# Patient Record
Sex: Female | Born: 1935
Health system: Southern US, Community
[De-identification: ages and names within clinical notes are randomized; demographics above are authoritative.]

## PROBLEM LIST (undated history)

## (undated) DIAGNOSIS — I10 Essential (primary) hypertension: Secondary | ICD-10-CM

## (undated) DIAGNOSIS — N185 Chronic kidney disease, stage 5: Secondary | ICD-10-CM

## (undated) DIAGNOSIS — I639 Cerebral infarction, unspecified: Secondary | ICD-10-CM

## (undated) DIAGNOSIS — E119 Type 2 diabetes mellitus without complications: Secondary | ICD-10-CM

## (undated) HISTORY — DX: Essential (primary) hypertension: I10

## (undated) HISTORY — PX: FRACTURE SURGERY: SHX138

## (undated) HISTORY — DX: Type 2 diabetes mellitus without complications: E11.9

## (undated) HISTORY — PX: TUBAL LIGATION: SHX77

## (undated) HISTORY — DX: Cerebral infarction, unspecified: I63.9

## (undated) HISTORY — DX: Chronic kidney disease, stage 5: N18.5

## (undated) HISTORY — PX: COLON SURGERY: SHX602

## (undated) HISTORY — PX: EYE SURGERY: SHX253

## (undated) HISTORY — PX: SPINE SURGERY: SHX786

---

## 1997-11-25 ENCOUNTER — Other Ambulatory Visit: Admission: RE | Admit: 1997-11-25 | Discharge: 1997-11-25 | Payer: Self-pay | Admitting: Endocrinology

## 1999-05-29 ENCOUNTER — Ambulatory Visit (HOSPITAL_COMMUNITY): Admission: RE | Admit: 1999-05-29 | Discharge: 1999-05-29 | Payer: Self-pay | Admitting: Ophthalmology

## 1999-11-02 ENCOUNTER — Encounter: Admission: RE | Admit: 1999-11-02 | Discharge: 1999-11-02 | Payer: Self-pay | Admitting: Family Medicine

## 1999-11-02 ENCOUNTER — Encounter: Payer: Self-pay | Admitting: Family Medicine

## 2001-10-20 ENCOUNTER — Encounter: Payer: Self-pay | Admitting: Neurosurgery

## 2001-10-21 ENCOUNTER — Encounter: Payer: Self-pay | Admitting: Neurosurgery

## 2001-10-21 ENCOUNTER — Ambulatory Visit (HOSPITAL_COMMUNITY): Admission: RE | Admit: 2001-10-21 | Discharge: 2001-10-22 | Payer: Self-pay | Admitting: Neurosurgery

## 2002-08-31 ENCOUNTER — Encounter: Payer: Self-pay | Admitting: Family Medicine

## 2002-08-31 ENCOUNTER — Encounter: Admission: RE | Admit: 2002-08-31 | Discharge: 2002-08-31 | Payer: Self-pay | Admitting: Family Medicine

## 2003-01-14 ENCOUNTER — Ambulatory Visit (HOSPITAL_COMMUNITY): Admission: RE | Admit: 2003-01-14 | Discharge: 2003-01-14 | Payer: Self-pay | Admitting: Gastroenterology

## 2003-01-14 ENCOUNTER — Encounter (INDEPENDENT_AMBULATORY_CARE_PROVIDER_SITE_OTHER): Payer: Self-pay | Admitting: Specialist

## 2003-08-16 ENCOUNTER — Ambulatory Visit (HOSPITAL_COMMUNITY): Admission: RE | Admit: 2003-08-16 | Discharge: 2003-08-16 | Payer: Self-pay | Admitting: Family Medicine

## 2003-11-11 ENCOUNTER — Ambulatory Visit (HOSPITAL_COMMUNITY): Admission: RE | Admit: 2003-11-11 | Discharge: 2003-11-11 | Payer: Self-pay | Admitting: Family Medicine

## 2007-07-16 ENCOUNTER — Encounter: Admission: RE | Admit: 2007-07-16 | Discharge: 2007-07-16 | Payer: Self-pay | Admitting: Orthopedic Surgery

## 2007-07-24 ENCOUNTER — Encounter: Admission: RE | Admit: 2007-07-24 | Discharge: 2007-07-24 | Payer: Self-pay | Admitting: *Deleted

## 2008-07-23 ENCOUNTER — Emergency Department (HOSPITAL_COMMUNITY): Admission: EM | Admit: 2008-07-23 | Discharge: 2008-07-23 | Payer: Self-pay | Admitting: Emergency Medicine

## 2009-02-07 ENCOUNTER — Encounter: Admission: RE | Admit: 2009-02-07 | Discharge: 2009-02-07 | Payer: Self-pay | Admitting: Internal Medicine

## 2010-01-13 ENCOUNTER — Inpatient Hospital Stay (HOSPITAL_COMMUNITY)
Admission: EM | Admit: 2010-01-13 | Discharge: 2010-01-16 | Payer: Self-pay | Source: Home / Self Care | Admitting: Emergency Medicine

## 2010-01-13 ENCOUNTER — Ambulatory Visit: Payer: Self-pay | Admitting: Family Medicine

## 2010-01-15 ENCOUNTER — Encounter: Payer: Self-pay | Admitting: Family Medicine

## 2010-01-15 ENCOUNTER — Ambulatory Visit: Payer: Self-pay | Admitting: Vascular Surgery

## 2010-01-17 ENCOUNTER — Emergency Department (HOSPITAL_COMMUNITY): Admission: EM | Admit: 2010-01-17 | Discharge: 2010-01-17 | Payer: Self-pay | Admitting: Emergency Medicine

## 2010-03-15 ENCOUNTER — Encounter: Admission: RE | Admit: 2010-03-15 | Discharge: 2010-03-15 | Payer: Self-pay | Admitting: Internal Medicine

## 2010-06-28 LAB — COMPREHENSIVE METABOLIC PANEL
ALT: 23 U/L (ref 0–35)
ALT: 37 U/L — ABNORMAL HIGH (ref 0–35)
AST: 28 U/L (ref 0–37)
AST: 49 U/L — ABNORMAL HIGH (ref 0–37)
Albumin: 3.6 g/dL (ref 3.5–5.2)
Albumin: 3.9 g/dL (ref 3.5–5.2)
Alkaline Phosphatase: 61 U/L (ref 39–117)
Alkaline Phosphatase: 61 U/L (ref 39–117)
BUN: 16 mg/dL (ref 6–23)
BUN: 21 mg/dL (ref 6–23)
CO2: 27 mEq/L (ref 19–32)
CO2: 27 mEq/L (ref 19–32)
Calcium: 9.5 mg/dL (ref 8.4–10.5)
Calcium: 9.7 mg/dL (ref 8.4–10.5)
Chloride: 103 mEq/L (ref 96–112)
Chloride: 99 mEq/L (ref 96–112)
Creatinine, Ser: 1.19 mg/dL (ref 0.4–1.2)
Creatinine, Ser: 1.2 mg/dL (ref 0.4–1.2)
GFR calc Af Amer: 53 mL/min — ABNORMAL LOW (ref >60)
GFR calc Af Amer: 54 mL/min — ABNORMAL LOW (ref >60)
GFR calc non Af Amer: 44 mL/min — ABNORMAL LOW (ref >60)
GFR calc non Af Amer: 44 mL/min — ABNORMAL LOW (ref >60)
Glucose, Bld: 108 mg/dL — ABNORMAL HIGH (ref 70–99)
Glucose, Bld: 145 mg/dL — ABNORMAL HIGH (ref 70–99)
Potassium: 4 mEq/L (ref 3.5–5.1)
Potassium: 4.1 mEq/L (ref 3.5–5.1)
Sodium: 134 mEq/L — ABNORMAL LOW (ref 135–145)
Sodium: 137 mEq/L (ref 135–145)
Total Bilirubin: 0.3 mg/dL (ref 0.3–1.2)
Total Bilirubin: 0.5 mg/dL (ref 0.3–1.2)
Total Protein: 6.2 g/dL (ref 6.0–8.3)
Total Protein: 6.9 g/dL (ref 6.0–8.3)

## 2010-06-28 LAB — DIFFERENTIAL
Basophils Absolute: 0.1 10*3/uL (ref 0.0–0.1)
Basophils Absolute: 0.1 10*3/uL (ref 0.0–0.1)
Basophils Relative: 1 % (ref 0–1)
Basophils Relative: 1 % (ref 0–1)
Eosinophils Absolute: 0.3 10*3/uL (ref 0.0–0.7)
Eosinophils Absolute: 0.4 10*3/uL (ref 0.0–0.7)
Eosinophils Relative: 5 % (ref 0–5)
Eosinophils Relative: 7 % — ABNORMAL HIGH (ref 0–5)
Lymphocytes Relative: 27 % (ref 12–46)
Lymphocytes Relative: 40 % (ref 12–46)
Lymphs Abs: 1.5 10*3/uL (ref 0.7–4.0)
Lymphs Abs: 2.5 10*3/uL (ref 0.7–4.0)
Monocytes Absolute: 0.3 10*3/uL (ref 0.1–1.0)
Monocytes Absolute: 0.4 10*3/uL (ref 0.1–1.0)
Monocytes Relative: 6 % (ref 3–12)
Monocytes Relative: 7 % (ref 3–12)
Neutro Abs: 2.8 10*3/uL (ref 1.7–7.7)
Neutro Abs: 3.2 10*3/uL (ref 1.7–7.7)
Neutrophils Relative %: 45 % (ref 43–77)
Neutrophils Relative %: 61 % (ref 43–77)

## 2010-06-28 LAB — BASIC METABOLIC PANEL
BUN: 15 mg/dL (ref 6–23)
BUN: 20 mg/dL (ref 6–23)
CO2: 24 mEq/L (ref 19–32)
CO2: 26 mEq/L (ref 19–32)
Calcium: 9.5 mg/dL (ref 8.4–10.5)
Calcium: 9.7 mg/dL (ref 8.4–10.5)
Chloride: 103 mEq/L (ref 96–112)
Chloride: 104 mEq/L (ref 96–112)
Creatinine, Ser: 1.18 mg/dL (ref 0.4–1.2)
Creatinine, Ser: 1.21 mg/dL — ABNORMAL HIGH (ref 0.4–1.2)
GFR calc Af Amer: 53 mL/min — ABNORMAL LOW (ref >60)
GFR calc Af Amer: 54 mL/min — ABNORMAL LOW (ref >60)
GFR calc non Af Amer: 44 mL/min — ABNORMAL LOW (ref >60)
GFR calc non Af Amer: 45 mL/min — ABNORMAL LOW (ref >60)
Glucose, Bld: 143 mg/dL — ABNORMAL HIGH (ref 70–99)
Glucose, Bld: 223 mg/dL — ABNORMAL HIGH (ref 70–99)
Potassium: 4.3 mEq/L (ref 3.5–5.1)
Potassium: 4.4 mEq/L (ref 3.5–5.1)
Sodium: 136 mEq/L (ref 135–145)
Sodium: 138 mEq/L (ref 135–145)

## 2010-06-28 LAB — CBC
HCT: 33.8 % — ABNORMAL LOW (ref 36.0–46.0)
HCT: 34.2 % — ABNORMAL LOW (ref 36.0–46.0)
HCT: 34.8 % — ABNORMAL LOW (ref 36.0–46.0)
Hemoglobin: 11.4 g/dL — ABNORMAL LOW (ref 12.0–15.0)
Hemoglobin: 11.9 g/dL — ABNORMAL LOW (ref 12.0–15.0)
Hemoglobin: 11.9 g/dL — ABNORMAL LOW (ref 12.0–15.0)
MCH: 28.8 pg (ref 26.0–34.0)
MCH: 29.2 pg (ref 26.0–34.0)
MCH: 29.4 pg (ref 26.0–34.0)
MCHC: 33.7 g/dL (ref 30.0–36.0)
MCHC: 34.2 g/dL (ref 30.0–36.0)
MCHC: 34.8 g/dL (ref 30.0–36.0)
MCV: 84.4 fL (ref 78.0–100.0)
MCV: 85.4 fL (ref 78.0–100.0)
MCV: 85.5 fL (ref 78.0–100.0)
Platelets: 219 10*3/uL (ref 150–400)
Platelets: 265 10*3/uL (ref 150–400)
Platelets: 266 10*3/uL (ref 150–400)
RBC: 3.96 MIL/uL (ref 3.87–5.11)
RBC: 4.05 MIL/uL (ref 3.87–5.11)
RBC: 4.07 MIL/uL (ref 3.87–5.11)
RDW: 12.6 % (ref 11.5–15.5)
RDW: 12.9 % (ref 11.5–15.5)
RDW: 12.9 % (ref 11.5–15.5)
WBC: 5.4 10*3/uL (ref 4.0–10.5)
WBC: 6.1 10*3/uL (ref 4.0–10.5)
WBC: 7.1 10*3/uL (ref 4.0–10.5)

## 2010-06-28 LAB — HEMOGLOBIN A1C
Hgb A1c MFr Bld: 6.8 % — ABNORMAL HIGH (ref <5.7)
Mean Plasma Glucose: 148 mg/dL — ABNORMAL HIGH (ref <117)

## 2010-06-28 LAB — GLUCOSE, CAPILLARY
Glucose-Capillary: 106 mg/dL — ABNORMAL HIGH (ref 70–99)
Glucose-Capillary: 109 mg/dL — ABNORMAL HIGH (ref 70–99)
Glucose-Capillary: 121 mg/dL — ABNORMAL HIGH (ref 70–99)
Glucose-Capillary: 122 mg/dL — ABNORMAL HIGH (ref 70–99)
Glucose-Capillary: 125 mg/dL — ABNORMAL HIGH (ref 70–99)
Glucose-Capillary: 133 mg/dL — ABNORMAL HIGH (ref 70–99)
Glucose-Capillary: 140 mg/dL — ABNORMAL HIGH (ref 70–99)
Glucose-Capillary: 190 mg/dL — ABNORMAL HIGH (ref 70–99)
Glucose-Capillary: 83 mg/dL (ref 70–99)
Glucose-Capillary: 95 mg/dL (ref 70–99)

## 2010-06-28 LAB — URINALYSIS, ROUTINE W REFLEX MICROSCOPIC
Bilirubin Urine: NEGATIVE
Glucose, UA: NEGATIVE mg/dL
Ketones, ur: NEGATIVE mg/dL
Nitrite: NEGATIVE
Protein, ur: 100 mg/dL — AB
Specific Gravity, Urine: 1.01 (ref 1.005–1.030)
Urobilinogen, UA: 0.2 mg/dL (ref 0.0–1.0)
pH: 5.5 (ref 5.0–8.0)

## 2010-06-28 LAB — LIPID PANEL
Cholesterol: 213 mg/dL — ABNORMAL HIGH (ref 0–200)
HDL: 48 mg/dL (ref >39)
LDL Cholesterol: 133 mg/dL — ABNORMAL HIGH (ref 0–99)
Total CHOL/HDL Ratio: 4.4 RATIO
Triglycerides: 161 mg/dL — ABNORMAL HIGH (ref <150)
VLDL: 32 mg/dL (ref 0–40)

## 2010-06-28 LAB — APTT: aPTT: 23 seconds — ABNORMAL LOW (ref 24–37)

## 2010-06-28 LAB — URINE MICROSCOPIC-ADD ON

## 2010-06-28 LAB — CK TOTAL AND CKMB (NOT AT ARMC)
CK, MB: 3.3 ng/mL (ref 0.3–4.0)
Relative Index: 2.4 (ref 0.0–2.5)
Total CK: 136 U/L (ref 7–177)

## 2010-06-28 LAB — TROPONIN I: Troponin I: 0.02 ng/mL (ref 0.00–0.06)

## 2010-06-28 LAB — PROTIME-INR
INR: 0.93 (ref 0.00–1.49)
Prothrombin Time: 12.7 seconds (ref 11.6–15.2)

## 2010-07-25 LAB — BASIC METABOLIC PANEL
BUN: 12 mg/dL (ref 6–23)
CO2: 25 mEq/L (ref 19–32)
Calcium: 9.3 mg/dL (ref 8.4–10.5)
Chloride: 96 mEq/L (ref 96–112)
Creatinine, Ser: 1.01 mg/dL (ref 0.4–1.2)
GFR calc Af Amer: >60 mL/min (ref >60)
GFR calc non Af Amer: 54 mL/min — ABNORMAL LOW (ref >60)
Glucose, Bld: 173 mg/dL — ABNORMAL HIGH (ref 70–99)
Potassium: 4.3 mEq/L (ref 3.5–5.1)
Sodium: 135 mEq/L (ref 135–145)

## 2010-07-25 LAB — DIFFERENTIAL
Basophils Absolute: 0 10*3/uL (ref 0.0–0.1)
Basophils Relative: 1 % (ref 0–1)
Eosinophils Absolute: 0.1 10*3/uL (ref 0.0–0.7)
Eosinophils Relative: 1 % (ref 0–5)
Lymphocytes Relative: 19 % (ref 12–46)
Lymphs Abs: 1.6 10*3/uL (ref 0.7–4.0)
Monocytes Absolute: 0.3 10*3/uL (ref 0.1–1.0)
Monocytes Relative: 4 % (ref 3–12)
Neutro Abs: 6.6 10*3/uL (ref 1.7–7.7)
Neutrophils Relative %: 76 % (ref 43–77)

## 2010-07-25 LAB — CBC
HCT: 34.4 % — ABNORMAL LOW (ref 36.0–46.0)
Hemoglobin: 11.7 g/dL — ABNORMAL LOW (ref 12.0–15.0)
MCHC: 34 g/dL (ref 30.0–36.0)
MCV: 86.5 fL (ref 78.0–100.0)
Platelets: 280 10*3/uL (ref 150–400)
RBC: 3.98 MIL/uL (ref 3.87–5.11)
RDW: 14.2 % (ref 11.5–15.5)
WBC: 8.7 10*3/uL (ref 4.0–10.5)

## 2010-08-31 NOTE — H&P (Signed)
[place]. [facility]  Patient:    Rose Cruz, Rose Cruz Visit Number: [id number] MRN: [id number]          Service Type: SUR Location: [id number] Attending Physician:  Melton Krebs Dictated by:   Elizabeth Sauer, M.D. Admit Date:  10/21/2001 Discharge Date: 10/22/2001                           History and Physical  ADMITTING DIAGNOSIS: Herniated disk, L4-5 on the right.  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  MRI has shown a herniated disk at L4-5 on the right and she is admitted for lumbar diskectomy.  PAST MEDICAL HISTORY:  1. Adult onset diabetes.  2. Hypertension.  MEDICATIONS:  1. ______ 500 mg two tablets b.i.d.  2. Atacand q.d.  3. Bayer aspirin one q.d.  4. Centrum Silver.  5. Vicodin.  6. Bextra.  ALLERGIES: PENICILLIN.  PAST SURGICAL HISTORY: Tubal ligation.  She is not on any hormone replacement.  SOCIAL HISTORY: She does not smoke or drink.  Works at the [employer].  FAMILY HISTORY: Not given.  REVIEW OF SYSTEMS: Remarkable for glasses, hypertension, leg pain, back pain, and vomiting.  PHYSICAL EXAMINATION:  HEENT: Within normal limits.  NECK: Reasonable range of motion.  CHEST: Clear.  CARDIAC: Regular rate and rhythm.  ABDOMEN: Nontender.  No hepatosplenomegaly.  EXTREMITIES: Without clubbing or cyanosis.  GU: Examination deferred.  EXTREMITIES: Peripheral pulses good.  NEUROLOGIC: She is awake and alert and oriented.  Cranial nerves intact. Motor examination shows 5/5 strength throughout the upper and lower extremities save for hesitancy of knee extensors on the right side.  Right knee jerk is absent, left knee jerk is 2.  Ankle jerks absent. Straight-leg-raise positive on the right.  LABORATORY DATA: Films have been reviewed above.  CLINICAL IMPRESSION: Lumbar herniated disk at  L4-5 with right L4 radiculopathy.  PLAN: The plan is for lumbar laminectomy and diskectomy.  The risks and benefits of this approach have been discussed with her and she wishes to proceed. Dictated by:   Elizabeth Sauer, M.D. Attending Physician:  Melton Krebs DD:  10/21/01 TD:  10/24/01 Job: [id number]

## 2010-08-31 NOTE — Op Note (Signed)
   NAME:  Rose Cruz, Rose Cruz                         ACCOUNT NO.:  [id number]   MEDICAL RECORD NO.:  [id number]                   PATIENT TYPE:  AMB   LOCATION:  ENDO                                 FACILITY:  [facility]   PHYSICIAN:  John C. Amedeo Plenty, M.D.                 DATE OF BIRTH:  23-May-1932   DATE OF PROCEDURE:  01/14/2003  DATE OF DISCHARGE:                                 OPERATIVE REPORT   PROCEDURE:  Colonoscopy.   INDICATIONS FOR PROCEDURE:  Anemia and family history of colon cancer in a  second degree relative.   DESCRIPTION OF PROCEDURE:  The patient was placed in the left lateral  decubitus position then placed on the pulse monitor with continuous low flow  oxygen delivered by nasal cannula. She was sedated with 50 mcg IV fentanyl  and 4 mg IV Versed. The Olympus video colonoscope was inserted into the  rectum and advanced to the cecum, confirmed by transillumination at  McBurney's point and visualization of the ileocecal valve and appendiceal  orifice. The prep was excellent. The cecum and ascending colon appeared  normal with no masses, polyps, diverticula or other mucosal abnormalities.  Within the transverse colon, there was a 6 mm polyp fulgurated by hot  biopsy. Otherwise, the remaining transverse colon appeared normal and did  the descending, sigmoid and rectum. The scope was then withdrawn and the  patient returned to the recovery room in stable condition. She tolerated the  procedure well and there were no immediate complications.   IMPRESSION:  Transverse colon polyp fulgurated by hot biopsy.   PLAN:  Await histology to determine method and interval for future colon  screening.                                               John C. Amedeo Plenty, M.D.    [facility]  D:  01/14/2003  T:  01/15/2003  Job:  [id number]   cc:   Biagio Borg, M.D.  [address] [place]  [place]  [place] [address]  Fax: [phone]

## 2010-08-31 NOTE — Op Note (Signed)
[place]. [facility]  Patient:    Rose Cruz, Rose Cruz Visit Number: [id number] MRN: [id number]          Service Type: SUR Location: [id number] Attending Physician:  Melton Krebs Dictated by:   Elizabeth Sauer, M.D. Proc. Date: 10/21/01 Admit Date:  10/21/2001 Discharge Date: 10/22/2001                             Operative Report  PREOPERATIVE DIAGNOSIS:  Herniated disk at L4-5 on the right.  POSTOPERATIVE DIAGNOSIS:  Herniated disk at L4-5 on the right.  PROCEDURE:  Right L4-5 laminectomy and diskectomy.  SURGEON:  Elizabeth Sauer, M.D.  NURSE ASSISTANT:  [facility].  DOCTOR ASSISTANT: Otilio Connors, M.D.  ANESTHESIA:  General endotracheal.  PREPARATION:  Sterile Betadine prep and scrub with alcohol wipe.  COMPLICATIONS:  None.  DESCRIPTION OF PROCEDURE:  This is a 77 year old right-handed white lady with a right L4 radiculopathy and a superiorly extruded disk at L4-5.  She was taken to the operating room and smoothly anesthetized and intubated and placed prone on the operating table.  Following shave, prep, and drape in the usual sterile fashion, the skin was infiltrated with 1% lidocaine with 1:400,000 epinephrine.  Skin was incised from the top of L4 to the top of L3, and the lamina of L4 and L3 were exposed on the right side in the subperiosteal plane. Intraoperative x-ray showed a mark under 3.  Hemisemilaminectomy was carried out at 4 to its superior end and the top of the ligamentum flavum that was removed in a retrograde fashion.  The lateral margin of the laminectomy was enlarged to visualize the lateral margin of the thecal sac.  The 5 root was retracted medially and the disk space was immediately evident.  Working out into the neural foramen, a large free fragment of disk was isolated and removed from underneath the 4 root in the foramen.  The annular fibers of the disk were then incised and the disk material removed from within it.   All graspable fragments were retrieved, and the neural foramen was also carefully explored once again to make sure that it contained no fragments.  Having completed this, the wound was irrigated and hemostasis assured.  Depo-Medrol- soaked fat was used to fill the laminectomy defect.  The fascia was reapproximated with 0 Vicryl in interrupted fashion, the subcutaneous tissue was reapproximated with 0 Vicryl in interrupted fashion.  The subcuticular tissue was reapproximated with 3-0 Vicryl in interrupted fashion.  The skin was closed with 3-0 nylon in a running locked fashion.  Bacitracin and Telfa dressing was applied and made occlusive with OpSite.  The patient returned to the recovery room in good condition. Dictated by:   Elizabeth Sauer, M.D. Attending Physician:  Melton Krebs DD:  10/21/01 TD:  10/24/01 Job: [id number]

## 2010-08-31 NOTE — H&P (Signed)
[place]. [facility]  Patient:    Rose Cruz, Rose Cruz                      MRN: [id number] Adm. Date:  [id number] Disc. Date: [id number] Attending:  Charlette Caffey                         History and Physical  CHIEF COMPLAINT:  This was a planned outpatient surgical admission of this 77 year old diabetic admitted for cataract implant surgery of the right eye.  HISTORY OF PRESENT ILLNESS:  This patient has been followed in my office since referral by Dr. Fabio Pierce in November 2000.  At that time, the patient was complaining of blurred vision in both eyes of at least one months duration. Examination revealed a visual acuity of 20/50 right eye, 20/50 left eye without  correction and 20/30 in each eye with correction.  Slit lamp examination revealed nuclear lens sclerosis.  Fundus examination revealed background diabetic retinopathy in both eyes with scattered hemorrhages, microaneurysms and hard exudates.  The patient continued to be followed and was noted to have progressive decrease in vision associated.  The patient had previous laser photocoagulation to the left eye on April 26, 1999.  Visual acuity continued to deteriorate and it was felt that the cataracts were becoming progressively worse.  When her vision had deteriorated to 20/200 right eye, 20/50 left eye, it was felt that further laser photocoagulation could not be carried out due to the increased cataract change. It was therefore elected to proceed with cataract implant surgery of the right eye at this time, left eye later.  The patient signed an informed consent and arrangements were made for outpatient admission at this time.  PAST MEDICAL HISTORY: The patient continues under the care of Dr. Forde Dandy and it elt to be in satisfactory condition for the proposed surgery.  The patient takes multiple medications under his direction including Glucophage, Tenoretic and Altace.  She is  said to be allergic to penicillin and is felt to be an average isk for the proposed surgery under local anesthesia.  REVIEW OF SYSTEMS:  No cardiorespiratory complaints.  PHYSICAL EXAMINATION:  GENERAL: The patient is a pleasant, well-nourished, well-developed white female in no acute distress.  HEENT:  Eyes: Ocular exam as noted above.  CHEST:  Lungs clear to percussion and auscultation.  HEART:  Normal sinus rhythm.  No cardiomegaly.  No murmurs.  ABDOMEN:  Negative.  EXTREMITIES:  Negative.  ADMISSION DIAGNOSES: 1. Posterior subcapsular cataract and nuclear cataract formation, right eye. 2. Diabetic retinopathy, both eyes.  SURGICAL PLAN: Planned extracapsular cataract extraction with primary insertion of posterior chamber intraocular lens implant, right eye.DD:  06/27/99 TD:  06/27/99 Job: [id number]

## 2010-08-31 NOTE — Op Note (Signed)
. [facility]  Patient:    Rose Cruz, Rose Cruz                      MRN: [id number] Proc. Date: 05/29/99 Adm. Date:  [id number] Disc. Date: [id number] Attending:  Charlette Caffey                           Operative Report  PREOPERATIVE DIAGNOSIS:  Cortical nuclear and posterior subcapsular cataract, right eye.  POSTOPERATIVE DIAGNOSIS:  Cortical nuclear and posterior subcapsular cataract, right eye.  OPERATION:  Planned extracapsular cataract extraction - phacoemulsification, primary insertion of posterior chamber intraocular lens implant.  SURGEON:  Garey Ham, M.D.  ASSISTANT:  Nurse  ANESTHESIA:  Local 4% xylocaine, 0.75 Marcaine retrobulbar block.  Topical tetracaine intraocular xylocaine.  Anesthesia standby required in this diabetic  patient.  The patient was give sodium pentothal or Ditropan intravenously during the period of retrobulbar injection.  DESCRIPTION OF PROCEDURE:  After the patient was prepped and draped, a speculum was inserted in the right eye.  The eye was turned downward and a superior rectus traction suture placed. Schiotz tonometry was recorded at 5 to 7 scale units with a 5.5 gram weight.  A peritomy was performed adjacent to the limbus from the 11 to 1:00 position.  The corneoscleral junction was cleaned and a corneoscleral groove made with a 45 degree Superblade.  The anterior chamber was then entered with the 2.5 mm diamond keratome at the 12:00 position and the 15 degree blade at the 2:30 position.  Using a bent 26 gauze needle on a Healon syringe a circular capsulorrhexis was begun and then completed with the Graybo forceps. Hydrodissection and hydrodelineation were performed using 1% xylocaine.  The lens nucleus was then emulsified with the phacoemulsifier.  A total ultrasonic time f 1 minute 34 seconds, average power level 15%.  The residual cortex was aspirated ith the irrigation aspiration  tip.  The posterior capsule appeared intact with a brilliant red fundus reflex.  It was therefore elected in this diabetic patient to use a polymethyl methacrylate lens rather than a silicone lens. A Storz model EZE-60 was inserted with the Shepherd forceps.  Diopter power +19.00.  This appeared to be well centered.  The pupil was then constricted with Miochol ophthalmic solution after the Healon which had been used during the procedure was aspirated.  Two sutures were used to close the enlarged superior incision at the 12:00 site.  Eserine ophthalmic ointment was instilled in the conjunctival cul-de-sac and a light patch and protector shield applied.  Duration of procedure and anesthesia administration 45 minutes.   The patient tolerated the procedure  well in general and left the operating room for the recovery room in good condition. DD:  06/27/99 TD:  06/27/99 Job: [id number]

## 2011-07-24 ENCOUNTER — Other Ambulatory Visit: Payer: Self-pay | Admitting: Internal Medicine

## 2011-08-28 ENCOUNTER — Ambulatory Visit (INDEPENDENT_AMBULATORY_CARE_PROVIDER_SITE_OTHER): Payer: PRIVATE HEALTH INSURANCE | Admitting: Internal Medicine

## 2011-08-28 VITALS — BP 137/65 | HR 72 | Temp 98.2°F | Resp 16 | Ht 64.0 in | Wt 161.0 lb

## 2011-08-28 DIAGNOSIS — D649 Anemia, unspecified: Secondary | ICD-10-CM | POA: Diagnosis not present

## 2011-08-28 DIAGNOSIS — E119 Type 2 diabetes mellitus without complications: Secondary | ICD-10-CM

## 2011-08-28 DIAGNOSIS — R238 Other skin changes: Secondary | ICD-10-CM

## 2011-08-28 DIAGNOSIS — IMO0001 Reserved for inherently not codable concepts without codable children: Secondary | ICD-10-CM | POA: Diagnosis not present

## 2011-08-28 DIAGNOSIS — E785 Hyperlipidemia, unspecified: Secondary | ICD-10-CM | POA: Diagnosis not present

## 2011-08-28 DIAGNOSIS — I10 Essential (primary) hypertension: Secondary | ICD-10-CM

## 2011-08-28 DIAGNOSIS — M791 Myalgia, unspecified site: Secondary | ICD-10-CM

## 2011-08-28 LAB — LIPID PANEL
Cholesterol: 165 mg/dL (ref 0–200)
HDL: 57 mg/dL (ref >39)
LDL Cholesterol: 84 mg/dL (ref 0–99)
Total CHOL/HDL Ratio: 2.9 Ratio
Triglycerides: 121 mg/dL (ref <150)
VLDL: 24 mg/dL (ref 0–40)

## 2011-08-28 LAB — COMPREHENSIVE METABOLIC PANEL
ALT: 19 U/L (ref 0–35)
AST: 29 U/L (ref 0–37)
Albumin: 4.2 g/dL (ref 3.5–5.2)
Alkaline Phosphatase: 52 U/L (ref 39–117)
BUN: 25 mg/dL — ABNORMAL HIGH (ref 6–23)
CO2: 29 mEq/L (ref 19–32)
Calcium: 10.2 mg/dL (ref 8.4–10.5)
Chloride: 104 mEq/L (ref 96–112)
Creat: 1.5 mg/dL — ABNORMAL HIGH (ref 0.50–1.10)
Glucose, Bld: 99 mg/dL (ref 70–99)
Potassium: 4.6 mEq/L (ref 3.5–5.3)
Sodium: 140 mEq/L (ref 135–145)
Total Bilirubin: 0.4 mg/dL (ref 0.3–1.2)
Total Protein: 6.2 g/dL (ref 6.0–8.3)

## 2011-08-28 LAB — CBC
HCT: 32.6 % — ABNORMAL LOW (ref 36.0–46.0)
Hemoglobin: 11.2 g/dL — ABNORMAL LOW (ref 12.0–15.0)
MCH: 29.3 pg (ref 26.0–34.0)
MCHC: 34.4 g/dL (ref 30.0–36.0)
MCV: 85.3 fL (ref 78.0–100.0)
Platelets: 309 10*3/uL (ref 150–400)
RBC: 3.82 MIL/uL — ABNORMAL LOW (ref 3.87–5.11)
RDW: 13.4 % (ref 11.5–15.5)
WBC: 5.3 10*3/uL (ref 4.0–10.5)

## 2011-08-28 LAB — POCT GLYCOSYLATED HEMOGLOBIN (HGB A1C): Hemoglobin A1C: 6.3

## 2011-08-28 LAB — CK: Total CK: 256 U/L — ABNORMAL HIGH (ref 7–177)

## 2011-08-28 LAB — POCT SEDIMENTATION RATE: POCT SED RATE: 25 mm/hr — AB (ref 0–22)

## 2011-08-28 MED ORDER — METFORMIN HCL 1000 MG PO TABS: 1000.0000 mg | ORAL_TABLET | Freq: Two times a day (BID) | ORAL | Status: DC

## 2011-08-28 MED ORDER — LOVASTATIN 10 MG PO TABS: 10.0000 mg | ORAL_TABLET | Freq: Every day | ORAL | Status: DC

## 2011-08-28 MED ORDER — LOSARTAN POTASSIUM-HCTZ 100-25 MG PO TABS: 1.0000 | ORAL_TABLET | Freq: Every day | ORAL | Status: DC

## 2011-08-28 MED ORDER — AMLODIPINE BESYLATE 10 MG PO TABS: 10.0000 mg | ORAL_TABLET | Freq: Every day | ORAL | Status: DC

## 2011-08-28 NOTE — Progress Notes (Signed)
Subjective:    Patient ID: Rose Cruz, female    DOB: [birth year], 78 y.o.   MRN: [id number]  HPIHere for followup of chronic medical problems She continues to complain of not being able to do as much as she used to. She is troubled at times by pain in her posterior hips as she tries to get up from sitting. This is not a consistent problem and is rarely present on arising from bed. There seems to be a deep ache in the muscles of the buttocks without change in the range of motion of the hips. She decreased her intake of iron and calcium and felt like this made her muscles better. She continues to remain active was in zumba classes twice a week and walking on a daily basis but feels she could do more.    Review of Systems  Constitutional: Negative for fever, appetite change and unexpected weight change.  HENT: Negative for hearing loss and tinnitus.   Eyes: Negative for photophobia and visual disturbance.  Respiratory: Negative for apnea, cough, choking and shortness of breath.   Cardiovascular: Negative for chest pain, palpitations and leg swelling.  Gastrointestinal: Negative for abdominal pain, diarrhea and constipation.  Genitourinary: Negative for difficulty urinating.  Musculoskeletal: Negative for joint swelling and gait problem.  Skin: Negative.   Neurological: Negative for dizziness, tremors, speech difficulty, weakness, light-headedness and headaches.       Status post CVA  Hematological:       She notes easy bruising on the forearms  Psychiatric/Behavioral: Negative for confusion, sleep disturbance, self-injury, dysphoric mood, decreased concentration and agitation. The patient is not nervous/anxious.        Objective:   Physical Exam  Constitutional: She is oriented to person, place, and time. She appears well-developed and well-nourished.  Eyes: EOM are normal. Pupils are equal, round, and reactive to light.  Neck: Neck supple. No thyromegaly present.  Cardiovascular:  Normal rate, regular rhythm, normal heart sounds and intact distal pulses.   No murmur heard. Pulmonary/Chest: Effort normal and breath sounds normal.  Abdominal: Bowel sounds are normal.  Musculoskeletal: She exhibits no edema and no tenderness.  Neurological: She is alert and oriented to person, place, and time. No cranial nerve deficit.       No sensory losses in the extremities  Skin:       Superficial bruises on both forearms/she has a small skin tag in the popliteal fossa which i removed with forceps  Psychiatric: She has a normal mood and affect. Her behavior is normal. Judgment and thought content normal.      Results for orders placed in visit on 08/28/11  SEDIMENTATION RATE-Canceled      Component Value Range   PRODUCT CODE      POCT GLYCOSYLATED HEMOGLOBIN (HGB A1C)      Component Value Range   Hemoglobin A1C 6.3    POCT SEDIMENTATION RATE      Component Value Range   POCT SED RATE 25 (*) 0 - 22 (mm/hr)       Assessment & Plan:   1. Myalgia  CK, Sedimentation rate, POCT SEDIMENTATION RATE  2. DM (diabetes mellitus)  CBC, Comprehensive metabolic panel, POCT glycosylated hemoglobin (Hb A1C), metFORMIN (GLUCOPHAGE) 1000 MG tablet, POCT SEDIMENTATION RATE  3. Hyperlipemia  Lipid panel, lovastatin (MEVACOR) 10 MG tablet, POCT SEDIMENTATION RATE  4. Anemia -Normocytic with no clear etiology POCT SEDIMENTATION RATE  5. HTN (hypertension)  amLODipine (NORVASC) 10 MG tablet, losartan-hydrochlorothiazide (HYZAAR) 100-25 MG per  tablet, POCT SEDIMENTATION RATE  6. Easy bruising -normal secondary to thin skin POCT SEDIMENTATION RATE   Consider low-dose statin as a possible cause of problem #1 Increase activity to see if endurance increases Notify lab work Meds ordered this encounter  Medications  .       .       .       . fish oil-omega-3 fatty acids 1000 MG capsule    Sig: Take 2 g by mouth daily.  . calcium carbonate (OS-CAL) 600 MG TABS    Sig: Take 600 mg by mouth  2 (two) times daily with a meal.  . aspirin 325 MG tablet    Sig: Take 325 mg by mouth daily.  . Multiple Vitamin (MULTIVITAMIN) tablet    Sig: Take 1 tablet by mouth daily.  . Ferrous Sulfate Dried 200 (65 FE) MG TABS    Sig: Take by mouth.  Marland Kitchen OVER THE COUNTER MEDICATION    Sig: Vitamin D 1000iu taking along w/Calciumm 600  . metFORMIN (GLUCOPHAGE) 1000 MG tablet    Sig: Take 1 tablet (1,000 mg total) by mouth 2 (two) times daily with a meal.    Dispense:  180 tablet    Refill:  1  . amLODipine (NORVASC) 10 MG tablet    Sig: Take 1 tablet (10 mg total) by mouth daily.    Dispense:  90 tablet    Refill:  1  . losartan-hydrochlorothiazide (HYZAAR) 100-25 MG per tablet    Sig: Take 1 tablet by mouth daily.    Dispense:  90 tablet    Refill:  1  . lovastatin (MEVACOR) 10 MG tablet    Sig: Take 1 tablet (10 mg total) by mouth at bedtime.    Dispense:  90 tablet    Refill:  1   Recheck 6 months

## 2011-08-29 LAB — SEDIMENTATION RATE: Sed Rate: 10 mm/hr (ref 0–22)

## 2011-09-03 ENCOUNTER — Encounter: Payer: Self-pay | Admitting: Internal Medicine

## 2011-09-06 ENCOUNTER — Telehealth: Payer: Self-pay

## 2011-09-06 NOTE — Telephone Encounter (Signed)
She could take her Mevacor every other day but I don't see another change that we could make at this point

## 2011-09-06 NOTE — Telephone Encounter (Signed)
Dr. Laney Pastor was supposed a note on the abnormal labs.  Patient is willing to wait until Dr. Laney Pastor returns.  Trying to reduce the number of medications she is taking.

## 2011-09-07 NOTE — Telephone Encounter (Signed)
Spoke with pt and advised message from Dr. Laney Pastor. Pt understood

## 2011-10-25 ENCOUNTER — Telehealth: Payer: Self-pay

## 2011-10-25 NOTE — Telephone Encounter (Signed)
Pt is wanting to talk with someone about metphormin and also make sure that she does not need to  almlodipine bsylate  Any more      [phone] ok to leave a message

## 2011-10-27 NOTE — Telephone Encounter (Signed)
Spoke with pt advised her not to discontinue Amlodipine and she will come by to see Dr Laney Pastor to discuss medications.

## 2011-10-27 NOTE — Telephone Encounter (Signed)
What are her questions about metformin? I do not see anything in her chart regarding D/C the amlodipine, so she should continue it. She should return for re-evaluation in November, per Dr. Ninfa Meeker last note.

## 2011-12-31 ENCOUNTER — Telehealth: Payer: Self-pay

## 2011-12-31 NOTE — Telephone Encounter (Signed)
PT CALLED AGAIN FOR BARBARA  [phone]

## 2011-12-31 NOTE — Telephone Encounter (Signed)
Pt dropped off form to be completed for her to work for [employer]. Form and chart are at Elwyn Reach' desk.  LMOM for pt to CB. Tell pt that I was able to find date of her last Tdap in our records, but we do not have records of any other immunizations for pt or TB results to fill in on form. Does pt need these on form? If so, she can bring [place] a copy of immunizations and we can do a TB test for her if needed. Dr Laney Pastor will be in tomorrow and may be able to complete the rest then.

## 2011-12-31 NOTE — Telephone Encounter (Signed)
Called pt back and she reported that [employer] may not need all the info on all immunizations, but she definitely needs a TB test done. Pt stated that she will come in tomorrow afternoon the have a PPD placed. Pt is aware that it takes 48-72 hours to get results read after placement. Form is in pt's paper chart to be completed when pt comes for PPD placement and filed back.

## 2012-01-01 ENCOUNTER — Ambulatory Visit (INDEPENDENT_AMBULATORY_CARE_PROVIDER_SITE_OTHER): Payer: Medicare Other | Admitting: Family Medicine

## 2012-01-01 VITALS — BP 138/58 | HR 62 | Temp 97.8°F | Resp 16 | Ht 63.5 in | Wt 164.2 lb

## 2012-01-01 DIAGNOSIS — E119 Type 2 diabetes mellitus without complications: Secondary | ICD-10-CM | POA: Diagnosis not present

## 2012-01-01 DIAGNOSIS — Z111 Encounter for screening for respiratory tuberculosis: Secondary | ICD-10-CM

## 2012-01-01 DIAGNOSIS — E118 Type 2 diabetes mellitus with unspecified complications: Secondary | ICD-10-CM | POA: Insufficient documentation

## 2012-01-01 DIAGNOSIS — I10 Essential (primary) hypertension: Secondary | ICD-10-CM | POA: Diagnosis not present

## 2012-01-01 NOTE — Progress Notes (Signed)
[facility] [address] [place] [address] [phone]  Date:  01/01/2012   Name:  Rose Cruz   DOB:  [birth year]   MRN:  [id number]  PCP:  No primary provider on file.    Chief Complaint: TB skin test   History of Present Illness:  Rose Cruz is a 78 y.o. 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   She has well- controlled HTN and DM.  She has no TB risk factors such as foreign origin or TB exposure or incarceration.  No symptoms such as hemoptysis, persistent cough, night sweats or unexplained weight loss.  She has had a PPD in the past which was negative.   There is no problem list on file for this patient.   No past medical history on file.  No past surgical history on file.  History  Substance Use Topics  . Smoking status: Never Smoker   . Smokeless tobacco: Not on file  . Alcohol Use: No    No family history on file.  Allergies  Allergen Reactions  . Penicillins     Whelps, swelling  . Statins     Legs hurt    Medication list has been reviewed and updated.  Current Outpatient Prescriptions on File Prior to Visit  Medication Sig Dispense Refill  . amLODipine (NORVASC) 10 MG tablet Take 1 tablet (10 mg total) by mouth daily.  90 tablet  1  . aspirin 325 MG tablet Take 325 mg by mouth daily.      . calcium carbonate (OS-CAL) 600 MG TABS Take 600 mg by mouth 2 (two) times daily with a meal.      . Ferrous Sulfate Dried 200 (65 FE) MG TABS Take by mouth.      . fish oil-omega-3 fatty acids 1000 MG capsule Take 2 g by mouth daily.      Marland Kitchen losartan-hydrochlorothiazide (HYZAAR) 100-25 MG per tablet Take 1 tablet by mouth daily.  90 tablet  1  . lovastatin (MEVACOR) 10 MG tablet Take 1 tablet (10 mg total) by mouth at bedtime.  90 tablet  1  . metFORMIN (GLUCOPHAGE) 1000 MG tablet Take 1 tablet (1,000 mg total) by mouth 2 (two) times  daily with a meal.  180 tablet  1  . Multiple Vitamin (MULTIVITAMIN) tablet Take 1 tablet by mouth daily.      Marland Kitchen OVER THE COUNTER MEDICATION Vitamin D 1000iu taking along w/Calciumm 600        Review of Systems:  As per HPI- otherwise negative.   Physical Examination: Filed Vitals:   01/01/12 1449  BP: 138/58  Pulse: 62  Temp: 97.8 F (36.6 C)  Resp: 16   Filed Vitals:   01/01/12 1449  Height: 5' 3.5" (1.613 m)  Weight: 164 lb 3.2 oz (74.481 kg)   Body mass index is 28.63 kg/(m^2). Ideal Body Weight: Weight in (lb) to have BMI = 25: 143.1   GEN: WDWN, NAD, Non-toxic, A & O x 3 HEENT: Atraumatic, Normocephalic. Neck supple. No masses, No LAD. Bilateral TM wnl, oropharynx normal.  PEERL,EOMI.   Ears and Nose: No external deformity. CV: RRR, No M/G/R. No JVD. No thrill. No extra heart sounds. PULM: CTA B, no wheezes, crackles, rhonchi. No retractions. No resp. distress. No accessory muscle use. ABD: S, NT, ND, +BS. No rebound. No HSM. EXTR:  No c/c/e NEURO Normal gait.  PSYCH: Normally interactive. Conversant. Not depressed or anxious appearing.  Calm demeanor.    Assessment and Plan: 1. HTN (hypertension)   2. Diabetes mellitus   3. Screening-pulmonary TB    Rose Cruz is here today for a PE for employment.  She is cleared for employment, but did not do a PPD for her today as she is low risk and there is a Producer, television/film/video.    Lamar Blinks, MD

## 2012-01-22 ENCOUNTER — Other Ambulatory Visit: Payer: Self-pay | Admitting: Internal Medicine

## 2012-01-22 ENCOUNTER — Encounter: Payer: Self-pay | Admitting: Internal Medicine

## 2012-01-22 ENCOUNTER — Ambulatory Visit (INDEPENDENT_AMBULATORY_CARE_PROVIDER_SITE_OTHER): Payer: Medicare Other | Admitting: Internal Medicine

## 2012-01-22 VITALS — BP 106/58 | HR 66 | Temp 98.9°F | Resp 16 | Ht 63.75 in | Wt 162.4 lb

## 2012-01-22 DIAGNOSIS — K635 Polyp of colon: Secondary | ICD-10-CM

## 2012-01-22 DIAGNOSIS — G459 Transient cerebral ischemic attack, unspecified: Secondary | ICD-10-CM

## 2012-01-22 DIAGNOSIS — I8390 Asymptomatic varicose veins of unspecified lower extremity: Secondary | ICD-10-CM

## 2012-01-22 DIAGNOSIS — Z23 Encounter for immunization: Secondary | ICD-10-CM | POA: Diagnosis not present

## 2012-01-22 DIAGNOSIS — M545 Low back pain, unspecified: Secondary | ICD-10-CM

## 2012-01-22 DIAGNOSIS — E785 Hyperlipidemia, unspecified: Secondary | ICD-10-CM

## 2012-01-22 DIAGNOSIS — IMO0001 Reserved for inherently not codable concepts without codable children: Secondary | ICD-10-CM

## 2012-01-22 DIAGNOSIS — E119 Type 2 diabetes mellitus without complications: Secondary | ICD-10-CM | POA: Diagnosis not present

## 2012-01-22 DIAGNOSIS — Z6828 Body mass index (BMI) 28.0-28.9, adult: Secondary | ICD-10-CM | POA: Insufficient documentation

## 2012-01-22 DIAGNOSIS — D649 Anemia, unspecified: Secondary | ICD-10-CM

## 2012-01-22 DIAGNOSIS — K08109 Complete loss of teeth, unspecified cause, unspecified class: Secondary | ICD-10-CM

## 2012-01-22 DIAGNOSIS — E538 Deficiency of other specified B group vitamins: Secondary | ICD-10-CM

## 2012-01-22 DIAGNOSIS — M549 Dorsalgia, unspecified: Secondary | ICD-10-CM

## 2012-01-22 DIAGNOSIS — G8929 Other chronic pain: Secondary | ICD-10-CM

## 2012-01-22 DIAGNOSIS — I10 Essential (primary) hypertension: Secondary | ICD-10-CM | POA: Diagnosis not present

## 2012-01-22 DIAGNOSIS — M791 Myalgia, unspecified site: Secondary | ICD-10-CM

## 2012-01-22 DIAGNOSIS — R809 Proteinuria, unspecified: Secondary | ICD-10-CM

## 2012-01-22 LAB — CBC WITH DIFFERENTIAL/PLATELET
Basophils Absolute: 0.1 10*3/uL (ref 0.0–0.1)
Basophils Relative: 1 % (ref 0–1)
Eosinophils Absolute: 0.4 10*3/uL (ref 0.0–0.7)
Eosinophils Relative: 6 % — ABNORMAL HIGH (ref 0–5)
HCT: 32.5 % — ABNORMAL LOW (ref 36.0–46.0)
Hemoglobin: 11.6 g/dL — ABNORMAL LOW (ref 12.0–15.0)
Lymphocytes Relative: 35 % (ref 12–46)
Lymphs Abs: 2.1 10*3/uL (ref 0.7–4.0)
MCH: 29.1 pg (ref 26.0–34.0)
MCHC: 35.7 g/dL (ref 30.0–36.0)
MCV: 81.5 fL (ref 78.0–100.0)
Monocytes Absolute: 0.4 10*3/uL (ref 0.1–1.0)
Monocytes Relative: 7 % (ref 3–12)
Neutro Abs: 3 10*3/uL (ref 1.7–7.7)
Neutrophils Relative %: 51 % (ref 43–77)
Platelets: 301 10*3/uL (ref 150–400)
RBC: 3.99 MIL/uL (ref 3.87–5.11)
RDW: 13.4 % (ref 11.5–15.5)
WBC: 6 10*3/uL (ref 4.0–10.5)

## 2012-01-22 LAB — LIPID PANEL
Cholesterol: 178 mg/dL (ref 0–200)
HDL: 52 mg/dL (ref >39)
LDL Cholesterol: 109 mg/dL — ABNORMAL HIGH (ref 0–99)
Total CHOL/HDL Ratio: 3.4 Ratio
Triglycerides: 87 mg/dL (ref <150)
VLDL: 17 mg/dL (ref 0–40)

## 2012-01-22 LAB — COMPREHENSIVE METABOLIC PANEL
ALT: 21 U/L (ref 0–35)
AST: 24 U/L (ref 0–37)
Albumin: 4.3 g/dL (ref 3.5–5.2)
Alkaline Phosphatase: 61 U/L (ref 39–117)
BUN: 19 mg/dL (ref 6–23)
CO2: 28 mEq/L (ref 19–32)
Calcium: 10.2 mg/dL (ref 8.4–10.5)
Chloride: 98 mEq/L (ref 96–112)
Creat: 1.25 mg/dL — ABNORMAL HIGH (ref 0.50–1.10)
Glucose, Bld: 91 mg/dL (ref 70–99)
Potassium: 4.3 mEq/L (ref 3.5–5.3)
Sodium: 137 mEq/L (ref 135–145)
Total Bilirubin: 0.4 mg/dL (ref 0.3–1.2)
Total Protein: 6.8 g/dL (ref 6.0–8.3)

## 2012-01-22 LAB — CK: Total CK: 192 U/L — ABNORMAL HIGH (ref 7–177)

## 2012-01-22 MED ORDER — LOSARTAN POTASSIUM-HCTZ 100-25 MG PO TABS: 1.0000 | ORAL_TABLET | Freq: Every day | ORAL | Status: DC

## 2012-01-22 MED ORDER — METFORMIN HCL 1000 MG PO TABS: 1000.0000 mg | ORAL_TABLET | Freq: Two times a day (BID) | ORAL | Status: DC

## 2012-01-22 MED ORDER — AMLODIPINE BESYLATE 10 MG PO TABS: 10.0000 mg | ORAL_TABLET | Freq: Every day | ORAL | Status: DC

## 2012-01-22 NOTE — Progress Notes (Signed)
Subjective:    Patient ID: Rose Cruz, female    DOB: 20-Jul-1932, 78 y.o.   MRN: [id number]  HPIf/u  Patient Active Problem List  Diagnosis  . HTN (hypertension)  . Diabetes mellitus  . BMI 28.0-28.9,adult   Current outpatient prescriptions; amLODipine (NORVASC) 10 MG tablet daily aspirin 325 MG tablet, Take 325 mg by mouth daily calcium carbonate (OS-CAL) 600 MG TABS, Take 600 mg by mouth 2 (two) times daily with a meal., Ferrous Sulfate Dried 200 (65 FE) MG TABS, Take by mouth. fish oil-omega-3 fatty acids 1000 MG capsule, Take 2 g by mouth daily., Disp: , Rfl: ;  losartan-hydrochlorothiazide (HYZAAR) 100-25 MG per tablet, Take 1 tablet by mouth daily metFORMIN (GLUCOPHAGE) 1000 MG tablet, Take 1 tablet (1,000 mg total) by mouth 2 (two) times daily with a meal Multiple Vitamin (MULTIVITAMIN) tablet, Take 1 tablet by mouth daily  Vitamin D 1000iu taking along w/Calciumm 600  losartan-hydrochlorothiazide (HYZAAR) 100-25 MG per tablet, Take 1 tablet by mouth  metFORMIN (GLUCOPHAGE) 1000 MG tablet, Take 1 tablet (1,000 mg total) by mouth 2 (two) times daily with a meal mevacor 10mg  daily  At her last visit she was having multiple aches and pains and CPK was slightly elevated She continues to have multiple aches and pains and has a fairly consistent problem with lumbar pain although this has not improved with discontinuing her exercise program and it is not disabling. This is bilateral lumbosacral pain which is occasional and which does not radiate and does not involve paresthesias or weakness in the lower extr. She is status post lumbar surgery by Dr. Carloyn Manner and has done well. No GU sxt. No pain at night. Occas pain in am. Quit Zumba 2 months ago bu t no change. Sitting in exam room waiting only for 2 hours has exacerbated this discomfort though it is still mild  Active Prob list Patient Active Problem List  Diagnosis  . HTN (hypertension)  . Diabetes mellitus  . BMI 28.0-28.9,adult    . Varicose veins  . Hyperlipidemia  . Anemia-chronic normocytic  . Colonic polyp-colonoscopy 2004  . Chronic back pain  . Microalbuminuria-2008  . B12 deficiency-2009  . Full dentures-age 48  . TIA (transient ischemic attack)-01/13/10    mammo 2011 Review of Systems No vision changes/has ophthalmology appointment this month No chest pain palpitation shortness of breath or peripheral edema No easy fatigability No gastrointestinal complaints No paresthesias    Objective:   Physical Exam Filed Vitals:   01/22/12 1434  BP: 106/58  Pulse: 66  Temp: 98.9 F (37.2 C)  Resp: 16   Pupils equal round reactive to light and accommodation Heart regular without murmurs/no carotid bruits Lungs clear No peripheral edema Back is slightly tender in the lumbar area bilaterally over the iliac crests Straight leg raise -90 bilaterally Deep tender reflexes 1+ patellar No sensory losses in the lower extremities to monofilament Gait is normal   Results for orders placed in visit on 01/22/12  CBC WITH DIFFERENTIAL      Component Value Range   WBC 6.0  4.0 - 10.5 K/uL   RBC 3.99  3.87 - 5.11 MIL/uL   Hemoglobin 11.6 (*) 12.0 - 15.0 g/dL   HCT 32.5 (*) 36.0 - 46.0 %   MCV 81.5  78.0 - 100.0 fL   MCH 29.1  26.0 - 34.0 pg   MCHC 35.7  30.0 - 36.0 g/dL   RDW 13.4  11.5 - 15.5 %   Platelets 301  150 - 400 K/uL       Assessment & Plan:  Problem #1 diabetes mellitus Problem #2 hypertension Problem #3 bmi 28 Problem #4 myalgias Problem #5 bilateral lumbar pain status post surgery Problem #6 persistent mild anemia Will discontinue Mevacor/recheck CPK Recheck lipid panel and metabolic panel Hemoglobin A1c Zostavax at [place] Flu vaccine today ? Bone density scan She has appointment for ophthalmology/mammogr ? Given handout for low back exercises with back booklet-if she doesn't respond quickly we will need to perform x-rays at her next office visit  Addend-labs Results for  orders placed in visit on 01/22/12  CBC WITH DIFFERENTIAL      Component Value Range   WBC 6.0  4.0 - 10.5 K/uL   RBC 3.99  3.87 - 5.11 MIL/uL   Hemoglobin 11.6 (*) 12.0 - 15.0 g/dL   HCT 32.5 (*) 36.0 - 46.0 %   MCV 81.5  78.0 - 100.0 fL   MCH 29.1  26.0 - 34.0 pg   MCHC 35.7  30.0 - 36.0 g/dL   RDW 13.4  11.5 - 15.5 %   Platelets 301  150 - 400 K/uL   Neutrophils Relative 51  43 - 77 %   Neutro Abs 3.0  1.7 - 7.7 K/uL   Lymphocytes Relative 35  12 - 46 %   Lymphs Abs 2.1  0.7 - 4.0 K/uL   Monocytes Relative 7  3 - 12 %   Monocytes Absolute 0.4  0.1 - 1.0 K/uL   Eosinophils Relative 6 (*) 0 - 5 %   Eosinophils Absolute 0.4  0.0 - 0.7 K/uL   Basophils Relative 1  0 - 1 %   Basophils Absolute 0.1  0.0 - 0.1 K/uL   Smear Review Criteria for review not met    COMPREHENSIVE METABOLIC PANEL      Component Value Range   Sodium 137  135 - 145 mEq/L   Potassium 4.3  3.5 - 5.3 mEq/L   Chloride 98  96 - 112 mEq/L   CO2 28  19 - 32 mEq/L   Glucose, Bld 91  70 - 99 mg/dL   BUN 19  6 - 23 mg/dL   Creat 1.25 (*) 0.50 - 1.10 mg/dL   Total Bilirubin 0.4  0.3 - 1.2 mg/dL   Alkaline Phosphatase 61  39 - 117 U/L   AST 24  0 - 37 U/L   ALT 21  0 - 35 U/L   Total Protein 6.8  6.0 - 8.3 g/dL   Albumin 4.3  3.5 - 5.2 g/dL   Calcium 10.2  8.4 - 10.5 mg/dL  LIPID PANEL      Component Value Range   Cholesterol 178  0 - 200 mg/dL   Triglycerides 87  <150 mg/dL   HDL 52  >39 mg/dL   Total CHOL/HDL Ratio 3.4     VLDL 17  0 - 40 mg/dL   LDL Cholesterol 109 (*) 0 - 99 mg/dL  CK      Component Value Range   Total CK 192 (*) 7 - 177 U/L

## 2012-01-24 ENCOUNTER — Encounter: Payer: Self-pay | Admitting: Internal Medicine

## 2012-01-24 DIAGNOSIS — K635 Polyp of colon: Secondary | ICD-10-CM | POA: Insufficient documentation

## 2012-01-24 DIAGNOSIS — E538 Deficiency of other specified B group vitamins: Secondary | ICD-10-CM | POA: Insufficient documentation

## 2012-01-24 DIAGNOSIS — M549 Dorsalgia, unspecified: Secondary | ICD-10-CM | POA: Insufficient documentation

## 2012-01-24 DIAGNOSIS — K08109 Complete loss of teeth, unspecified cause, unspecified class: Secondary | ICD-10-CM | POA: Insufficient documentation

## 2012-01-24 DIAGNOSIS — R809 Proteinuria, unspecified: Secondary | ICD-10-CM | POA: Insufficient documentation

## 2012-01-24 DIAGNOSIS — G459 Transient cerebral ischemic attack, unspecified: Secondary | ICD-10-CM | POA: Insufficient documentation

## 2012-01-24 DIAGNOSIS — I8390 Asymptomatic varicose veins of unspecified lower extremity: Secondary | ICD-10-CM | POA: Insufficient documentation

## 2012-01-24 DIAGNOSIS — E785 Hyperlipidemia, unspecified: Secondary | ICD-10-CM | POA: Insufficient documentation

## 2012-01-24 DIAGNOSIS — G8929 Other chronic pain: Secondary | ICD-10-CM | POA: Insufficient documentation

## 2012-01-24 DIAGNOSIS — D649 Anemia, unspecified: Secondary | ICD-10-CM | POA: Insufficient documentation

## 2012-01-24 NOTE — Progress Notes (Signed)
Pt made appt for April (6 months), sent note to Dr. Laney Pastor to confirm if 3 month or 6 month appt needed.

## 2012-01-25 ENCOUNTER — Telehealth: Payer: Self-pay | Admitting: Family Medicine

## 2012-01-25 LAB — HEMOGLOBIN A1C
Hgb A1c MFr Bld: 6.5 % — ABNORMAL HIGH (ref <5.7)
Mean Plasma Glucose: 140 mg/dL — ABNORMAL HIGH (ref <117)

## 2012-01-25 NOTE — Telephone Encounter (Signed)
Talked with Arbie Cookey ANN from [facility] and they added on the A1C  to this patient specimen

## 2012-01-27 ENCOUNTER — Encounter: Payer: Self-pay | Admitting: Internal Medicine

## 2012-01-29 NOTE — Progress Notes (Signed)
Six month f-up OK per Dr. Laney Pastor.jj

## 2012-02-26 ENCOUNTER — Encounter: Payer: Self-pay | Admitting: Physician Assistant

## 2012-07-22 ENCOUNTER — Ambulatory Visit (INDEPENDENT_AMBULATORY_CARE_PROVIDER_SITE_OTHER): Payer: Medicare Other | Admitting: Internal Medicine

## 2012-07-22 ENCOUNTER — Encounter: Payer: Self-pay | Admitting: Internal Medicine

## 2012-07-22 ENCOUNTER — Ambulatory Visit: Payer: PRIVATE HEALTH INSURANCE

## 2012-07-22 VITALS — BP 140/58 | HR 69 | Temp 97.8°F | Resp 16 | Ht 64.0 in | Wt 160.0 lb

## 2012-07-22 DIAGNOSIS — R809 Proteinuria, unspecified: Secondary | ICD-10-CM

## 2012-07-22 DIAGNOSIS — E119 Type 2 diabetes mellitus without complications: Secondary | ICD-10-CM | POA: Diagnosis not present

## 2012-07-22 DIAGNOSIS — M8580 Other specified disorders of bone density and structure, unspecified site: Secondary | ICD-10-CM

## 2012-07-22 DIAGNOSIS — M791 Myalgia, unspecified site: Secondary | ICD-10-CM

## 2012-07-22 DIAGNOSIS — I10 Essential (primary) hypertension: Secondary | ICD-10-CM | POA: Diagnosis not present

## 2012-07-22 DIAGNOSIS — IMO0001 Reserved for inherently not codable concepts without codable children: Secondary | ICD-10-CM | POA: Diagnosis not present

## 2012-07-22 DIAGNOSIS — G459 Transient cerebral ischemic attack, unspecified: Secondary | ICD-10-CM

## 2012-07-22 DIAGNOSIS — Z13820 Encounter for screening for osteoporosis: Secondary | ICD-10-CM

## 2012-07-22 DIAGNOSIS — E785 Hyperlipidemia, unspecified: Secondary | ICD-10-CM

## 2012-07-22 DIAGNOSIS — G8929 Other chronic pain: Secondary | ICD-10-CM

## 2012-07-22 DIAGNOSIS — M549 Dorsalgia, unspecified: Secondary | ICD-10-CM

## 2012-07-22 DIAGNOSIS — D649 Anemia, unspecified: Secondary | ICD-10-CM | POA: Diagnosis not present

## 2012-07-22 DIAGNOSIS — E538 Deficiency of other specified B group vitamins: Secondary | ICD-10-CM | POA: Diagnosis not present

## 2012-07-22 LAB — COMPREHENSIVE METABOLIC PANEL
ALT: 22 U/L (ref 0–35)
AST: 23 U/L (ref 0–37)
Albumin: 4.3 g/dL (ref 3.5–5.2)
Alkaline Phosphatase: 68 U/L (ref 39–117)
BUN: 24 mg/dL — ABNORMAL HIGH (ref 6–23)
CO2: 28 mEq/L (ref 19–32)
Calcium: 10.1 mg/dL (ref 8.4–10.5)
Chloride: 100 mEq/L (ref 96–112)
Creat: 1.22 mg/dL — ABNORMAL HIGH (ref 0.50–1.10)
Glucose, Bld: 105 mg/dL — ABNORMAL HIGH (ref 70–99)
Potassium: 4.2 mEq/L (ref 3.5–5.3)
Sodium: 136 mEq/L (ref 135–145)
Total Bilirubin: 0.3 mg/dL (ref 0.3–1.2)
Total Protein: 6.6 g/dL (ref 6.0–8.3)

## 2012-07-22 LAB — POCT SEDIMENTATION RATE: POCT SED RATE: 38 mm/hr — AB (ref 0–22)

## 2012-07-22 LAB — CBC WITH DIFFERENTIAL/PLATELET
Basophils Absolute: 0.1 10*3/uL (ref 0.0–0.1)
Basophils Relative: 1 % (ref 0–1)
Eosinophils Absolute: 0.6 10*3/uL (ref 0.0–0.7)
Eosinophils Relative: 9 % — ABNORMAL HIGH (ref 0–5)
HCT: 32.2 % — ABNORMAL LOW (ref 36.0–46.0)
Hemoglobin: 11.1 g/dL — ABNORMAL LOW (ref 12.0–15.0)
Lymphocytes Relative: 30 % (ref 12–46)
Lymphs Abs: 1.9 10*3/uL (ref 0.7–4.0)
MCH: 27.9 pg (ref 26.0–34.0)
MCHC: 34.5 g/dL (ref 30.0–36.0)
MCV: 80.9 fL (ref 78.0–100.0)
Monocytes Absolute: 0.3 10*3/uL (ref 0.1–1.0)
Monocytes Relative: 5 % (ref 3–12)
Neutro Abs: 3.5 10*3/uL (ref 1.7–7.7)
Neutrophils Relative %: 55 % (ref 43–77)
Platelets: 313 10*3/uL (ref 150–400)
RBC: 3.98 MIL/uL (ref 3.87–5.11)
RDW: 14.5 % (ref 11.5–15.5)
WBC: 6.4 10*3/uL (ref 4.0–10.5)

## 2012-07-22 LAB — IRON AND TIBC
%SAT: 20 % (ref 20–55)
Iron: 74 ug/dL (ref 42–145)
TIBC: 368 ug/dL (ref 250–470)
UIBC: 294 ug/dL (ref 125–400)

## 2012-07-22 LAB — LIPID PANEL
Cholesterol: 225 mg/dL — ABNORMAL HIGH (ref 0–200)
HDL: 61 mg/dL (ref >39)
LDL Cholesterol: 140 mg/dL — ABNORMAL HIGH (ref 0–99)
Total CHOL/HDL Ratio: 3.7 Ratio
Triglycerides: 120 mg/dL (ref <150)
VLDL: 24 mg/dL (ref 0–40)

## 2012-07-22 LAB — VITAMIN B12: Vitamin B-12: 367 pg/mL (ref 211–911)

## 2012-07-22 LAB — TSH: TSH: 1.298 u[IU]/mL (ref 0.350–4.500)

## 2012-07-22 LAB — CK: Total CK: 242 U/L — ABNORMAL HIGH (ref 7–177)

## 2012-07-22 NOTE — Progress Notes (Addendum)
  Subjective:    Patient ID: Rose Cruz, female    DOB: 23-May-1932, 79 y.o.   MRN: [id number]  HPI Patient Active Problem List  Diagnosis  . HTN (hypertension)  . Diabetes mellitus  . BMI 28.0-28.9,adult  . Varicose veins  . Hyperlipidemia  . Anemia-chronic normocytic  . Colonic polyp-colonoscopy 2004  . Chronic back pain  . Microalbuminuria-2008  . B12 deficiency-2009  . Full dentures-age 40  . TIA (transient ischemic attack)-01/13/10   C/o back pain exacerbation/not as much myalgias as before(Dr Carloyn Manner, Dr Vertell Limber, Dr Murphy)-s/p LS surg 10y ago///much better p 1 week new mattress Sis died liver CA// Mom of some unidentified Ca No wt loss  Review of Systems No addit sxt    Objective:   Physical Exam BP 140/58  Pulse 69  Temp(Src) 97.8 F (36.6 C) (Oral)  Resp 16  Ht 5\' 4"  (1.626 m)  Wt 160 lb (72.576 kg)  BMI 27.45 kg/m2  SpO2 100% heent cl Ht reg wout m No edem Neg sens loss toes slr neg 90 bilat Gets up wout assis      [facility] reading (PRIMARY) by  Dr. Laney Pastor Spondylolith L4-5 w/ other degen changes   Assessment & Plan:  HTN (hypertension) - Plan: CBC with Differential, Comprehensive metabolic panel  Diabetes mellitus - Plan: Lipid panel  TIA (transient ischemic attack)-01/13/10  Microalbuminuria-2008  Hyperlipidemia - Plan: Lipid panel  Chronic back pain - Plan: POCT SEDIMENTATION RATE, DG Lumbar Spine 2-3 Views  B12 deficiency-2009 - Plan: Vitamin B12  Anemia-chronic normocytic - Plan: TSH, Vitamin B12, Iron and TIBC  Myalgia - Plan: POCT SEDIMENTATION RATE, CK  Labs Ref meds Set up dexa#1  07/27/12-Labs suggest need to increase mevacor watching for a rise in CPK or a n increase in muscle pain

## 2012-07-27 ENCOUNTER — Encounter: Payer: Self-pay | Admitting: Internal Medicine

## 2012-07-27 MED ORDER — LOSARTAN POTASSIUM-HCTZ 100-25 MG PO TABS: 1.0000 | ORAL_TABLET | Freq: Every day | ORAL | Status: DC

## 2012-07-27 MED ORDER — METFORMIN HCL 1000 MG PO TABS: 1000.0000 mg | ORAL_TABLET | Freq: Two times a day (BID) | ORAL | Status: DC

## 2012-07-27 MED ORDER — AMLODIPINE BESYLATE 10 MG PO TABS: 10.0000 mg | ORAL_TABLET | Freq: Every day | ORAL | Status: DC

## 2012-07-27 MED ORDER — LOVASTATIN 10 MG PO TABS: 20.0000 mg | ORAL_TABLET | Freq: Every day | ORAL | Status: DC

## 2012-07-27 NOTE — Addendum Note (Signed)
Addended by: Leandrew Koyanagi on: 07/27/2012 02:39 PM   Modules accepted: Orders

## 2012-08-04 ENCOUNTER — Telehealth: Payer: Self-pay

## 2012-08-04 NOTE — Telephone Encounter (Signed)
PT HAVE A COUPLE OF QUESTIONS TO ASK REGARDING HER CHOL MEDICINE. PLEASE CALL [phone]. WOULD LIKE TO STOP TAKEN IT FOR A LITTLE WHILE AND DIDN'T KNOW IF THAT WAS A GOOD IDEA.

## 2012-08-04 NOTE — Telephone Encounter (Signed)
Good idea. 

## 2012-08-04 NOTE — Telephone Encounter (Signed)
Called her, she states she got letter cholesterol meds may need to be increased. She states she is having back pains and legs painful hamstrings aching. Patient states she wants to d/c this medication and see if the pain resolves, advised her to d/c and let us know if it resolves or not.

## 2012-09-02 ENCOUNTER — Telehealth: Payer: Self-pay

## 2012-09-02 DIAGNOSIS — M4317 Spondylolisthesis, lumbosacral region: Secondary | ICD-10-CM

## 2012-09-02 NOTE — Telephone Encounter (Signed)
Dr. Laney Pastor: Patient is having more back problems and she wants to talk to you about her problems. Her best number is [phone]

## 2012-09-02 NOTE — Telephone Encounter (Signed)
Called her, to find out what is going on. She states her pain is constant now, she is using 3-4 aspirin a day with some relief. She states you xrayed it, she wants to know what she should do about this.

## 2012-09-02 NOTE — Telephone Encounter (Signed)
We need to have her see Dr Lynann Bologna for spondylolithesis/treatment

## 2012-09-03 NOTE — Telephone Encounter (Signed)
Pt stated that she failed to tell Amy yesterday that her daughter works at Dr [employer] office and he had suggested that pt ask Dr Laney Pastor to order an MRI LS if he thinks it is appropriate. If Dr Laney Pastor feels that a referral first is the best plan, she would like to be referred to Dr Vertell Limber.

## 2012-09-03 NOTE — Telephone Encounter (Signed)
Called, left message for her to call me back.

## 2012-09-04 NOTE — Telephone Encounter (Signed)
Left message for her to advise.

## 2012-09-04 NOTE — Telephone Encounter (Signed)
Mri ordered then referral to Dr Vertell Limber

## 2012-09-09 ENCOUNTER — Ambulatory Visit
Admission: RE | Admit: 2012-09-09 | Discharge: 2012-09-09 | Disposition: A | Discharge status: Home / Self Care | Payer: Medicare Other | Source: Ambulatory Visit | Attending: Internal Medicine | Admitting: Internal Medicine

## 2012-09-09 DIAGNOSIS — M48061 Spinal stenosis, lumbar region without neurogenic claudication: Secondary | ICD-10-CM | POA: Diagnosis not present

## 2012-09-09 DIAGNOSIS — M5126 Other intervertebral disc displacement, lumbar region: Secondary | ICD-10-CM | POA: Diagnosis not present

## 2012-09-09 DIAGNOSIS — M4317 Spondylolisthesis, lumbosacral region: Secondary | ICD-10-CM

## 2012-09-10 ENCOUNTER — Inpatient Hospital Stay: Admission: RE | Admit: 2012-09-10 | Payer: Self-pay | Source: Ambulatory Visit

## 2012-09-21 DIAGNOSIS — M48061 Spinal stenosis, lumbar region without neurogenic claudication: Secondary | ICD-10-CM | POA: Diagnosis not present

## 2012-09-21 DIAGNOSIS — M4310 Spondylolisthesis, site unspecified: Secondary | ICD-10-CM | POA: Diagnosis not present

## 2012-09-21 DIAGNOSIS — M545 Low back pain, unspecified: Secondary | ICD-10-CM | POA: Diagnosis not present

## 2012-09-21 DIAGNOSIS — M5126 Other intervertebral disc displacement, lumbar region: Secondary | ICD-10-CM | POA: Diagnosis not present

## 2012-12-09 DIAGNOSIS — T1510XA Foreign body in conjunctival sac, unspecified eye, initial encounter: Secondary | ICD-10-CM | POA: Diagnosis not present

## 2012-12-22 DIAGNOSIS — E11319 Type 2 diabetes mellitus with unspecified diabetic retinopathy without macular edema: Secondary | ICD-10-CM | POA: Diagnosis not present

## 2012-12-22 DIAGNOSIS — E1139 Type 2 diabetes mellitus with other diabetic ophthalmic complication: Secondary | ICD-10-CM | POA: Diagnosis not present

## 2012-12-25 ENCOUNTER — Ambulatory Visit (INDEPENDENT_AMBULATORY_CARE_PROVIDER_SITE_OTHER): Payer: Medicare Other | Admitting: Family Medicine

## 2012-12-25 ENCOUNTER — Ambulatory Visit: Payer: Medicare Other

## 2012-12-25 VITALS — BP 142/72 | HR 70 | Temp 97.8°F | Resp 18 | Ht 63.5 in | Wt 164.8 lb

## 2012-12-25 DIAGNOSIS — S93602A Unspecified sprain of left foot, initial encounter: Secondary | ICD-10-CM

## 2012-12-25 DIAGNOSIS — M25579 Pain in unspecified ankle and joints of unspecified foot: Secondary | ICD-10-CM | POA: Diagnosis not present

## 2012-12-25 DIAGNOSIS — M25572 Pain in left ankle and joints of left foot: Secondary | ICD-10-CM

## 2012-12-25 DIAGNOSIS — S93609A Unspecified sprain of unspecified foot, initial encounter: Secondary | ICD-10-CM | POA: Diagnosis not present

## 2012-12-25 NOTE — Progress Notes (Signed)
[place], [place]  [id number]   [phone]  Subjective:    Patient ID: Rose Cruz, female    DOB: Dec 16, 1932, 79 y.o.   MRN: [id number]  HPI This 79 y.o. female presents for evaluation of L foot injury.  Getting out of bed this morning and slipped; foot twisted and fell out of bed.  L lateral and dorsal foot pain.  +swelling.  Can bear weight but hurts.  Mowed front yard this morning on riding lawnmower.  No n/t.  No ice; added aspercream heat to area but washed it.  Took ASA.  PCP: Laney Pastor   Review of Systems  Musculoskeletal: Positive for myalgias, joint swelling, arthralgias and gait problem.  Skin: Negative for wound.  Neurological: Negative for weakness and numbness.   Past Medical History  Diagnosis Date  . Diabetes mellitus without complication   . Hypertension   . Stroke    Allergies  Allergen Reactions  . Penicillins     Whelps, swelling  . Statins     Legs hurt   Current Outpatient Prescriptions on File Prior to Visit  Medication Sig Dispense Refill  . amLODipine (NORVASC) 10 MG tablet Take 1 tablet (10 mg total) by mouth daily.  90 tablet  1  . aspirin 325 MG tablet Take 325 mg by mouth daily.      . fish oil-omega-3 fatty acids 1000 MG capsule Take 2 g by mouth daily.      Marland Kitchen losartan-hydrochlorothiazide (HYZAAR) 100-25 MG per tablet Take 1 tablet by mouth daily.  90 tablet  1  . metFORMIN (GLUCOPHAGE) 1000 MG tablet Take 1 tablet (1,000 mg total) by mouth 2 (two) times daily with a meal.  180 tablet  1  . Multiple Vitamin (MULTIVITAMIN) tablet Take 1 tablet by mouth daily.      . calcium carbonate (OS-CAL) 600 MG TABS Take 600 mg by mouth 2 (two) times daily with a meal.      . Ferrous Sulfate Dried 200 (65 FE) MG TABS Take by mouth.      . lovastatin (MEVACOR) 10 MG tablet Take 2 tablets (20 mg total) by mouth at bedtime.  90 tablet  1  . OVER THE COUNTER MEDICATION Vitamin D 1000iu taking along w/Calciumm 600       No current  facility-administered medications on file prior to visit.   History   Social History  . Marital Status: Widowed    Spouse Name: N/A    Number of Children: N/A  . Years of Education: N/A   Occupational History  . 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 Smoking status: Never Smoker   . Smokeless tobacco: Not on file  . Alcohol Use: No  . Drug Use: No  . Sexual Activity: No   Other Topics Concern  . Not on file   Social History Narrative   Seldom exercises.       Objective:   Physical Exam  Nursing note and vitals reviewed. Constitutional: She is oriented to person, place, and time.  Cardiovascular: Intact distal pulses.   Musculoskeletal:       Left ankle: Normal.       Left lower leg: Normal.       Legs:      Left foot: She exhibits tenderness, bony tenderness and swelling. She exhibits normal range of motion, normal capillary refill, no crepitus, no deformity and no  laceration.  L FOOT:  MILD SWELLING LATERAL ASPECT OF FOOT; PROXIMALLY LOCATED.  +TTP LATERAL PROXIMAL LOCATION; FULL ROM OF L ANKLE WITHOUT PAIN.  Neurological: She is alert and oriented to person, place, and time.  Skin: Skin is warm and dry. No rash noted. No erythema. No pallor.  Psychiatric: She has a normal mood and affect. Her behavior is normal.   [facility] reading (PRIMARY) by  Dr. Tamala Julian.  L FOOT: NAD.      Assessment & Plan:  Pain in joint, ankle and foot, left - Plan: DG Foot Complete Left  Sprain of foot, left, initial encounter   1. Pain/sprain L foot lateral:  New.  Recommend ice, elevation, supportive shoes for two weeks.  RTC one week if no improvement.

## 2012-12-25 NOTE — Patient Instructions (Addendum)
1.  Elevate foot tonight. 2.  Ice foot twice daily for the next five days for 15-20 minutes. 3.  Wear supportive tennis shoes while walking fro the next 1-2 weeks.

## 2013-02-03 ENCOUNTER — Encounter: Payer: Self-pay | Admitting: Internal Medicine

## 2013-02-03 ENCOUNTER — Ambulatory Visit (INDEPENDENT_AMBULATORY_CARE_PROVIDER_SITE_OTHER): Payer: Medicare Other | Admitting: Internal Medicine

## 2013-02-03 VITALS — BP 138/58 | HR 79 | Temp 97.0°F | Resp 16 | Ht 64.5 in | Wt 165.2 lb

## 2013-02-03 DIAGNOSIS — I10 Essential (primary) hypertension: Secondary | ICD-10-CM | POA: Diagnosis not present

## 2013-02-03 DIAGNOSIS — M549 Dorsalgia, unspecified: Secondary | ICD-10-CM

## 2013-02-03 DIAGNOSIS — E785 Hyperlipidemia, unspecified: Secondary | ICD-10-CM | POA: Diagnosis not present

## 2013-02-03 DIAGNOSIS — E119 Type 2 diabetes mellitus without complications: Secondary | ICD-10-CM

## 2013-02-03 DIAGNOSIS — R011 Cardiac murmur, unspecified: Secondary | ICD-10-CM | POA: Diagnosis not present

## 2013-02-03 DIAGNOSIS — G729 Myopathy, unspecified: Secondary | ICD-10-CM | POA: Diagnosis not present

## 2013-02-03 DIAGNOSIS — R1011 Right upper quadrant pain: Secondary | ICD-10-CM

## 2013-02-03 DIAGNOSIS — G8929 Other chronic pain: Secondary | ICD-10-CM

## 2013-02-03 LAB — CBC WITH DIFFERENTIAL/PLATELET
Basophils Absolute: 0 10*3/uL (ref 0.0–0.1)
Basophils Relative: 1 % (ref 0–1)
Eosinophils Absolute: 0.2 10*3/uL (ref 0.0–0.7)
Eosinophils Relative: 4 % (ref 0–5)
HCT: 31.5 % — ABNORMAL LOW (ref 36.0–46.0)
Hemoglobin: 10.9 g/dL — ABNORMAL LOW (ref 12.0–15.0)
Lymphocytes Relative: 26 % (ref 12–46)
Lymphs Abs: 1.5 10*3/uL (ref 0.7–4.0)
MCH: 28.4 pg (ref 26.0–34.0)
MCHC: 34.6 g/dL (ref 30.0–36.0)
MCV: 82 fL (ref 78.0–100.0)
Monocytes Absolute: 0.3 10*3/uL (ref 0.1–1.0)
Monocytes Relative: 5 % (ref 3–12)
Neutro Abs: 4 10*3/uL (ref 1.7–7.7)
Neutrophils Relative %: 64 % (ref 43–77)
Platelets: 348 10*3/uL (ref 150–400)
RBC: 3.84 MIL/uL — ABNORMAL LOW (ref 3.87–5.11)
RDW: 14 % (ref 11.5–15.5)
WBC: 6 10*3/uL (ref 4.0–10.5)

## 2013-02-03 LAB — LIPID PANEL
Cholesterol: 216 mg/dL — ABNORMAL HIGH (ref 0–200)
HDL: 52 mg/dL (ref >39)
LDL Cholesterol: 138 mg/dL — ABNORMAL HIGH (ref 0–99)
Total CHOL/HDL Ratio: 4.2 Ratio
Triglycerides: 130 mg/dL (ref <150)
VLDL: 26 mg/dL (ref 0–40)

## 2013-02-03 LAB — COMPREHENSIVE METABOLIC PANEL
ALT: 19 U/L (ref 0–35)
AST: 20 U/L (ref 0–37)
Albumin: 4.1 g/dL (ref 3.5–5.2)
Alkaline Phosphatase: 75 U/L (ref 39–117)
BUN: 22 mg/dL (ref 6–23)
CO2: 25 mEq/L (ref 19–32)
Calcium: 9.9 mg/dL (ref 8.4–10.5)
Chloride: 100 mEq/L (ref 96–112)
Creat: 1.24 mg/dL — ABNORMAL HIGH (ref 0.50–1.10)
Glucose, Bld: 109 mg/dL — ABNORMAL HIGH (ref 70–99)
Potassium: 4.4 mEq/L (ref 3.5–5.3)
Sodium: 136 mEq/L (ref 135–145)
Total Bilirubin: 0.3 mg/dL (ref 0.3–1.2)
Total Protein: 6.5 g/dL (ref 6.0–8.3)

## 2013-02-03 LAB — POCT GLYCOSYLATED HEMOGLOBIN (HGB A1C): Hemoglobin A1C: 6.3

## 2013-02-03 LAB — CK: Total CK: 164 U/L (ref 7–177)

## 2013-02-03 MED ORDER — METFORMIN HCL 1000 MG PO TABS: 1000.0000 mg | ORAL_TABLET | Freq: Two times a day (BID) | ORAL | Status: DC

## 2013-02-03 MED ORDER — AMLODIPINE BESYLATE 10 MG PO TABS: 10.0000 mg | ORAL_TABLET | Freq: Every day | ORAL | Status: DC

## 2013-02-03 MED ORDER — LOSARTAN POTASSIUM-HCTZ 100-25 MG PO TABS: 1.0000 | ORAL_TABLET | Freq: Every day | ORAL | Status: DC

## 2013-02-03 NOTE — Progress Notes (Signed)
Subjective:    Patient ID: Rose Cruz, female    DOB: December 10, 1932, 79 y.o.   MRN: [id number]  HPI Back still continues to hurt her every day. She stopped lovastatin a couple months ago (10/28/12). Had some improvement. Has hx of stsin problems and believed this was causing part of problem. But MRI abn and is s/p surgery so ref to Dr. Vertell Limber gave her some back exercises to do and she is doing post TIA exercises that have helped her. Some back pain at night, but not as much. Changed mattress with some relief, is planning on new mattress purchase in future. Is very annoyed at effect on activity level and need for pain meds--would favor PT! CPK was up in spring.  Having more stomach problems. Ongoing 6 months.  Had nausea last night. Takes 3-4 aspirin a day and occasionally adds Back and Body aspirin 1-2 a day. Pain particularly after eating and with greasy foods. Thinks last colonoscopy was 2 years ago. Mother and sister had cancer in liver/gall bladder. Stopped iron and calcium 11/13/12.  Is currently working for [employer], converting records. Still works with [employer].  Is open to physical therapy for her back.   Had UTI 09/14/12. Has mild improvement of back pain with treatment.   Patient Active Problem List   Diagnosis Date Noted  . HTN (hypertension) 01/01/2012    Priority: Medium  . Diabetes mellitus 01/01/2012    Priority: Medium  . Varicose veins 01/24/2012  . Hyperlipidemia 01/24/2012  . Anemia-chronic normocytic 01/24/2012  . Colonic polyp-colonoscopy 2004 01/24/2012  . Chronic back pain 01/24/2012  . Microalbuminuria-2008 01/24/2012  . B12 deficiency-2009 01/24/2012  . Full dentures-age 48 01/24/2012  . TIA (transient ischemic attack)-01/13/10 01/24/2012  . BMI 28.0-28.9,adult 01/22/2012      Review of Systems  Constitutional: Negative for fever, appetite change, fatigue and unexpected weight change.  Eyes: Negative for visual disturbance.  Respiratory: Negative for  cough and shortness of breath.   Cardiovascular: Negative for chest pain, palpitations and leg swelling.  Gastrointestinal: Negative for blood in stool.       Colonos 2010 Dr Amedeo Plenty wnl  Genitourinary: Negative for difficulty urinating.  Musculoskeletal: Negative for joint swelling and neck pain.  Skin: Negative for rash.  Neurological: Negative for speech difficulty, light-headedness and headaches.  Psychiatric/Behavioral: Negative for dysphoric mood.       Objective:   Physical Exam  Nursing note and vitals reviewed. Constitutional: She is oriented to person, place, and time. She appears well-developed and well-nourished. No distress.  Eyes: Conjunctivae and EOM are normal. Pupils are equal, round, and reactive to light.  Neck: Normal range of motion. No thyromegaly present.  Cardiovascular: Normal rate, regular rhythm, S1 normal and S2 normal.   Murmur heard.  Systolic murmur is present with a grade of 2/6  Upper L and R sternal border//ejec type  Pulmonary/Chest: Effort normal and breath sounds normal.  Abdominal: Soft. She exhibits no mass.  Musculoskeletal: Normal range of motion. She exhibits no edema.  Lymphadenopathy:    She has no cervical adenopathy.  Neurological: She is alert and oriented to person, place, and time. She has normal reflexes. No cranial nerve deficit.  monofil neg  Skin: Skin is warm and dry. She is not diaphoretic.  Psychiatric: She has a normal mood and affect. Her behavior is normal. Judgment and thought content normal.      Assessment & Plan:  HTN (hypertension) - Plan: losartan-hydrochlorothiazide (HYZAAR) 100-25 MG per tablet, amLODipine (NORVASC)  10 MG tablet  Diabetes mellitus - Plan: POCT glycosylated hemoglobin (Hb A1C),  Chronic back pain - Plan: Comprehensive metabolic panel/esr  Hyperlipemia - Plan: Lipid Panel//probably statin intolerant  Heart murmur - Plan: follow for now/?aortic sclerosis(echp 2011=thickened aortic)  Myopathy -  Plan: Comprehensive metabolic panel, Sedimentation Rate, CK  DM (diabetes mellitus) - Plan: metFORMIN (GLUCOPHAGE) 1000 MG tablet, POCT glycosylated hemoglobin (Hb A1C)  1-HTN- check labs, continue current meds. 2-DM- check HbA1C, CMP, continue current meds 3-Back pain- PT, patient given information as well as printout of her MRI results for PT referral to Dr Angela Adam 4-Abdominal pain- encouraged her to only take 1 aspirin a day and try acetaminophen for pain. Labs, GB US.

## 2013-02-04 ENCOUNTER — Encounter: Payer: Self-pay | Admitting: Internal Medicine

## 2013-02-04 LAB — SEDIMENTATION RATE: Sed Rate: 30 mm/hr — ABNORMAL HIGH (ref 0–22)

## 2013-02-12 ENCOUNTER — Ambulatory Visit
Admission: RE | Admit: 2013-02-12 | Discharge: 2013-02-12 | Disposition: A | Discharge status: Home / Self Care | Payer: Medicare Other | Source: Ambulatory Visit | Attending: Internal Medicine | Admitting: Internal Medicine

## 2013-02-12 DIAGNOSIS — R1011 Right upper quadrant pain: Secondary | ICD-10-CM | POA: Diagnosis not present

## 2013-02-22 ENCOUNTER — Other Ambulatory Visit: Payer: Self-pay | Admitting: Internal Medicine

## 2013-05-12 ENCOUNTER — Telehealth: Payer: Self-pay

## 2013-05-12 NOTE — Telephone Encounter (Signed)
Patient states she is having cold symptoms and wants to know what OTC products she could use. States she does not think she needs to come in for an OV.  [phone] or [phone]

## 2013-05-14 NOTE — Telephone Encounter (Signed)
Pt is feeling much better. She took Tylenol Cold and Flu for her symptoms.

## 2013-07-23 ENCOUNTER — Ambulatory Visit (INDEPENDENT_AMBULATORY_CARE_PROVIDER_SITE_OTHER): Payer: Medicare Other | Admitting: Family Medicine

## 2013-07-23 VITALS — BP 150/60 | HR 74 | Temp 98.0°F | Resp 16 | Wt 160.0 lb

## 2013-07-23 DIAGNOSIS — Z23 Encounter for immunization: Secondary | ICD-10-CM

## 2013-07-23 DIAGNOSIS — S61409A Unspecified open wound of unspecified hand, initial encounter: Secondary | ICD-10-CM

## 2013-07-23 DIAGNOSIS — M79609 Pain in unspecified limb: Secondary | ICD-10-CM | POA: Diagnosis not present

## 2013-07-23 DIAGNOSIS — M79641 Pain in right hand: Secondary | ICD-10-CM

## 2013-07-23 NOTE — Progress Notes (Signed)
Subjective: Pleasant 80 year old lady who had just mowed her lawn today. 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 She said it back out, and the breeze was blowing in the paddles were turning and it hit her hand, cutting her in the webspace of the first and second digits of the right hand. She doesn't know when her last tetanus shot was, but in going back through her records it appears that is at least 7 or 8 years that she has not had one, so we will repeat aTDAP today. She is otherwise healthy and self-sufficient. Going to [employer].  Objective: Pleasant lady in no acute distress. In the webspace D. of her right hand between the thumb and forefinger she has a small 1.5 CM laceration. Chest distal to that, and a little more around to the back of the finger, he is a second small laceration about 1 CM which gapes open when pulled on.    Assessment: Lacerations right hand Pain right hand  Plan: Sutures tdaP

## 2013-07-23 NOTE — Patient Instructions (Signed)
You received a TDAP vaccine today  Return in about 10 days for suture removal  WOUND CARE Please return in 10 days to have your stitches/staples removed or sooner if you have concerns. Marland Kitchen Keep area clean and dry for 24 hours. Do not remove bandage, if applied. . After 24 hours, remove bandage and wash wound gently with mild soap and warm water. Reapply a new bandage after cleaning wound, if directed. . Continue daily cleansing with soap and water until stitches/staples are removed. . Do not apply any ointments or creams to the wound while stitches/staples are in place, as this may cause delayed healing. . Notify the office if you experience any of the following signs of infection: Swelling, redness, pus drainage, streaking, fever >101.0 F . Notify the office if you experience excessive bleeding that does not stop after 15-20 minutes of constant, firm pressure.

## 2013-07-23 NOTE — Progress Notes (Signed)
I directly supervised and participated in the procedure and agree with the student's documentation.  

## 2013-07-23 NOTE — Progress Notes (Signed)
VCO.  Local anesthesia with 3cc of 2% lidocaine.  Soapy water was used to wash wound, then rinsed.  SP&D.  Closed with 5-0 ethilon, #1 hm and #2 si sutures. Cleansed and dressed.

## 2013-08-04 ENCOUNTER — Ambulatory Visit (INDEPENDENT_AMBULATORY_CARE_PROVIDER_SITE_OTHER): Payer: Medicare Other | Admitting: Physician Assistant

## 2013-08-04 ENCOUNTER — Ambulatory Visit (INDEPENDENT_AMBULATORY_CARE_PROVIDER_SITE_OTHER): Payer: Medicare Other | Admitting: Internal Medicine

## 2013-08-04 ENCOUNTER — Encounter: Payer: Self-pay | Admitting: Internal Medicine

## 2013-08-04 VITALS — BP 108/70 | HR 72 | Temp 98.0°F | Resp 17 | Ht 64.5 in | Wt 161.0 lb

## 2013-08-04 VITALS — BP 126/52 | HR 61 | Temp 98.5°F | Resp 16 | Ht 64.0 in | Wt 160.6 lb

## 2013-08-04 DIAGNOSIS — G8929 Other chronic pain: Secondary | ICD-10-CM

## 2013-08-04 DIAGNOSIS — I10 Essential (primary) hypertension: Secondary | ICD-10-CM

## 2013-08-04 DIAGNOSIS — D649 Anemia, unspecified: Secondary | ICD-10-CM

## 2013-08-04 DIAGNOSIS — E119 Type 2 diabetes mellitus without complications: Secondary | ICD-10-CM | POA: Diagnosis not present

## 2013-08-04 DIAGNOSIS — M549 Dorsalgia, unspecified: Secondary | ICD-10-CM | POA: Diagnosis not present

## 2013-08-04 DIAGNOSIS — Z4802 Encounter for removal of sutures: Secondary | ICD-10-CM

## 2013-08-04 DIAGNOSIS — E785 Hyperlipidemia, unspecified: Secondary | ICD-10-CM

## 2013-08-04 LAB — POCT GLYCOSYLATED HEMOGLOBIN (HGB A1C): Hemoglobin A1C: 6.8

## 2013-08-04 LAB — GLUCOSE, POCT (MANUAL RESULT ENTRY): POC Glucose: 138 mg/dl — AB (ref 70–99)

## 2013-08-04 MED ORDER — METFORMIN HCL 1000 MG PO TABS: 1000.0000 mg | ORAL_TABLET | Freq: Two times a day (BID) | ORAL | Status: DC

## 2013-08-04 MED ORDER — LOSARTAN POTASSIUM-HCTZ 100-25 MG PO TABS: 1.0000 | ORAL_TABLET | Freq: Every day | ORAL | Status: DC

## 2013-08-04 NOTE — Progress Notes (Signed)
   Subjective:    Patient ID: Rose Cruz, female    DOB: [birth year], 80 y.o.   MRN: [id number]  Suture / Staple Removal     Rose Cruz is a very pleasant 80 yr old female here for suture removal.  Sutures were placed here 07/23/13.  She denies pain or drainage.    Review of Systems  Constitutional: Negative.   Respiratory: Negative.   Cardiovascular: Negative.   Skin: Positive for wound.       Objective:   Physical Exam  Vitals reviewed. Constitutional: She is oriented to person, place, and time. She appears well-developed and well-nourished. No distress.  HENT:  Head: Normocephalic and atraumatic.  Pulmonary/Chest: Effort normal.  Neurological: She is alert and oriented to person, place, and time.  Skin: Skin is warm and dry.  Well healed laceration at RIGHT hand; edges well approximated; sutures removed without difficulty  Psychiatric: She has a normal mood and affect. Her behavior is normal.       Assessment & Plan:  Visit for suture removal   Rose Cruz is a very pleasant 80 yr old female here for suture removal.  Wound is well healed.  Sutures removed.  RTC if concerns.     Rose Cruz MHS, PA-C [facility] 4/22/201511:26 AM

## 2013-08-04 NOTE — Progress Notes (Signed)
This chart was scribed for Rose Koyanagi, MD by Rose Cruz, Scribe. This patient was seen in room 24 and the patient's care was started at 10:21 AM.  Subjective:    Patient ID: Rose Cruz, female    DOB: [birth year], 80 y.o.   MRN: [id number]  HPI  HPI Comments: Rose Cruz is a 80 y.o. female with a history of DM, HTN, HLD, chronic anemia and chronic back pain who presents to the [facility] for to be evaluated for her history of DM and HTN. She is also here for medication refills. She states that she has been taking her medications to manage her DM (Metformin) and HTN (Norvasc and Hyzaar) as prescribed. She states that she doesn't believe that she needs 2 medications to manage her blood pressure anymore. Her blood pressure was taken here to be 126/52. Her A1C test resulted normally today. She states that she is still having aching back pain. She reports that she has not seen a PT for this, however, she is wanting to follow-up with PT. She states that she is working part-time. She states that she has quit taking Coenzyme Q and Fish oil, to improve her bowel function. She states that she is taking 2 doses iron tablets daily.  She also states that she injured her right hand last week, and had sutures placed here last week. She states that her hand has healed well since being treated here. She states that she recently traveled to the beach last week.    Patient Active Problem List   Diagnosis Date Noted  . Varicose veins 01/24/2012  . Hyperlipidemia 01/24/2012  . Anemia-chronic normocytic 01/24/2012  . Colonic polyp-colonoscopy 2004 01/24/2012  . Chronic back pain- still having aching back pain, wants to follow-up with PT 01/24/2012  . Microalbuminuria-2008 01/24/2012  . B12 deficiency-2009 01/24/2012  . Full dentures-age 65 01/24/2012  . TIA (transient ischemic attack)-01/13/10 01/24/2012  . BMI 28.0-28.9,adult 01/22/2012  . HTN (hypertension)- reports  that this is under control (126/52 this visit) 01/01/2012  . Diabetes mellitus- normal A1C today 01/01/2012    Review of Systems     Objective:   Physical Exam  Nursing note and vitals reviewed. Constitutional: She is oriented to person, place, and time. She appears well-developed and well-nourished. No distress.  HENT:  Head: Normocephalic and atraumatic.  Nose: Nose normal.  Mouth/Throat: Oropharynx is clear and moist.  Eyes: Conjunctivae and EOM are normal. Pupils are equal, round, and reactive to light.  Neck: Normal range of motion. Neck supple. No JVD present. No thyromegaly present.  Cardiovascular: Normal rate, regular rhythm and intact distal pulses.  Exam reveals no gallop and no friction rub.   Murmur heard. 1-2/6 sem at outflow tract No carotid bruits  Pulmonary/Chest: Effort normal. No respiratory distress.  Musculoskeletal: Normal range of motion. She exhibits no edema.  Lymphadenopathy:    She has no cervical adenopathy.  Neurological: She is alert and oriented to person, place, and time. She has normal reflexes.  Skin: Skin is warm and dry.  Psychiatric: She has a normal mood and affect. Her behavior is normal.   Results for orders placed in visit on 08/04/13  GLUCOSE, POCT (MANUAL RESULT ENTRY)      Result Value Ref Range   POC Glucose 138 (*) 70 - 99 mg/dl  POCT GLYCOSYLATED HEMOGLOBIN (HGB A1C)      Result Value Ref Range   Hemoglobin A1C 6.8  BP 126/52  Pulse 61  Temp(Src) 98.5 F (36.9 C) (Oral)  Resp 16  Ht 5\' 4"  (1.626 m)  Wt 160 lb 9.6 oz (72.848 kg)  BMI 27.55 kg/m2  SpO2 99%  Assessment & Plan:   1. DM (diabetes mellitus) -controlled!  2.  HTN stable and possibly able to stop amlodipine 3.  HL-intolerant to statins 4.  CVA 2011 5.  Chronic back pain--continue to increase activity--PT next with Rose Cruz or Rose Cruz  6.  Chronic GI symptoms improving as she decreases meds 7.  Chronic Anemia-stable on Fe  Meds ordered this  encounter  Medications  . losartan-hydrochlorothiazide (HYZAAR) 100-25 MG per tablet    Sig: Take 1 tablet by mouth daily.    Dispense:  90 tablet    Refill:  1  . metFORMIN (GLUCOPHAGE) 1000 MG tablet    Sig: Take 1 tablet (1,000 mg total) by mouth 2 (two) times daily with a meal.    Dispense:  180 tablet    Refill:  1     PT/cont meds F/u with Rose Cruz as well     I have completed the patient encounter in its entirety as documented by the scribe, with editing by me where necessary. Rose Cruz, M.D.

## 2013-08-09 ENCOUNTER — Telehealth: Payer: Self-pay | Admitting: Radiology

## 2013-08-09 NOTE — Telephone Encounter (Signed)
Patient advised. She indicates she has now stopped the Amlodipine, and she will let us know if her blood pressures go up, currently they are within normal range.

## 2013-08-09 NOTE — Telephone Encounter (Signed)
Dr Laney Pastor indicates he forgot to tell patient the physical therapy names: Barbaraann Barthel or [name], will call with this information

## 2013-08-18 ENCOUNTER — Telehealth: Payer: Self-pay | Admitting: Internal Medicine

## 2013-08-18 NOTE — Telephone Encounter (Signed)
LM for rtn call- Pt does not have any pain medication on her med list. No history of chronic pain. She will need to come to follow up with Dr. Laney Pastor.

## 2013-08-18 NOTE — Telephone Encounter (Signed)
Pt has chronic back pain. Recommended increase activity and PT. Can we now refer her to [facility]?

## 2013-08-18 NOTE — Telephone Encounter (Signed)
Patient wants to know if she can be referred to [facility].   [phone]

## 2013-08-19 ENCOUNTER — Telehealth: Payer: Self-pay

## 2013-08-19 NOTE — Telephone Encounter (Signed)
Anderson Malta with [facility] called stating that patient has come in and made an appt for 08/30/13 at 930 and that an order needs to be faxed to [phone]  Anderson Malta can be contacted at [phone]

## 2013-08-19 NOTE — Telephone Encounter (Signed)
Pt will be getting in contact with office for PT.  It looks like they have been calling her.

## 2013-08-23 NOTE — Telephone Encounter (Signed)
i'll do this wed 5/13

## 2013-08-30 DIAGNOSIS — M545 Low back pain, unspecified: Secondary | ICD-10-CM | POA: Diagnosis not present

## 2013-09-03 DIAGNOSIS — M545 Low back pain, unspecified: Secondary | ICD-10-CM | POA: Diagnosis not present

## 2013-09-04 ENCOUNTER — Other Ambulatory Visit: Payer: Self-pay | Admitting: Internal Medicine

## 2013-09-07 DIAGNOSIS — M545 Low back pain, unspecified: Secondary | ICD-10-CM | POA: Diagnosis not present

## 2013-09-09 ENCOUNTER — Telehealth: Payer: Self-pay

## 2013-09-09 DIAGNOSIS — I10 Essential (primary) hypertension: Secondary | ICD-10-CM

## 2013-09-09 DIAGNOSIS — E119 Type 2 diabetes mellitus without complications: Secondary | ICD-10-CM

## 2013-09-09 MED ORDER — LOSARTAN POTASSIUM-HCTZ 100-25 MG PO TABS: 1.0000 | ORAL_TABLET | Freq: Every day | ORAL | Status: DC

## 2013-09-09 MED ORDER — METFORMIN HCL 1000 MG PO TABS: 1000.0000 mg | ORAL_TABLET | Freq: Two times a day (BID) | ORAL | Status: DC

## 2013-09-09 NOTE — Telephone Encounter (Signed)
[facility] called to check on refill requests they sent on 09/04/13. They were sent back as denied bc Rfs had been sent in April, but upon checking records the April Rxs were sent to a different pharmacy. Advised pharmacist I will resend RFs to them.

## 2013-09-10 DIAGNOSIS — M545 Low back pain, unspecified: Secondary | ICD-10-CM | POA: Diagnosis not present

## 2013-09-14 DIAGNOSIS — M545 Low back pain, unspecified: Secondary | ICD-10-CM | POA: Diagnosis not present

## 2013-09-16 DIAGNOSIS — M545 Low back pain, unspecified: Secondary | ICD-10-CM | POA: Diagnosis not present

## 2013-09-24 DIAGNOSIS — M545 Low back pain, unspecified: Secondary | ICD-10-CM | POA: Diagnosis not present

## 2013-09-27 DIAGNOSIS — M545 Low back pain, unspecified: Secondary | ICD-10-CM | POA: Diagnosis not present

## 2013-09-29 DIAGNOSIS — M545 Low back pain, unspecified: Secondary | ICD-10-CM | POA: Diagnosis not present

## 2013-10-05 DIAGNOSIS — M545 Low back pain, unspecified: Secondary | ICD-10-CM | POA: Diagnosis not present

## 2013-10-07 DIAGNOSIS — M545 Low back pain, unspecified: Secondary | ICD-10-CM | POA: Diagnosis not present

## 2013-10-08 ENCOUNTER — Other Ambulatory Visit: Payer: Self-pay | Admitting: Internal Medicine

## 2013-10-11 DIAGNOSIS — M545 Low back pain, unspecified: Secondary | ICD-10-CM | POA: Diagnosis not present

## 2013-10-18 DIAGNOSIS — M545 Low back pain, unspecified: Secondary | ICD-10-CM | POA: Diagnosis not present

## 2013-10-21 DIAGNOSIS — M545 Low back pain, unspecified: Secondary | ICD-10-CM | POA: Diagnosis not present

## 2013-10-26 DIAGNOSIS — M545 Low back pain, unspecified: Secondary | ICD-10-CM | POA: Diagnosis not present

## 2013-10-28 DIAGNOSIS — M545 Low back pain, unspecified: Secondary | ICD-10-CM | POA: Diagnosis not present

## 2013-11-09 DIAGNOSIS — M545 Low back pain, unspecified: Secondary | ICD-10-CM | POA: Diagnosis not present

## 2013-11-11 DIAGNOSIS — M545 Low back pain, unspecified: Secondary | ICD-10-CM | POA: Diagnosis not present

## 2013-11-15 DIAGNOSIS — M545 Low back pain, unspecified: Secondary | ICD-10-CM | POA: Diagnosis not present

## 2013-11-18 DIAGNOSIS — M545 Low back pain, unspecified: Secondary | ICD-10-CM | POA: Diagnosis not present

## 2013-11-22 DIAGNOSIS — M545 Low back pain, unspecified: Secondary | ICD-10-CM | POA: Diagnosis not present

## 2013-11-29 DIAGNOSIS — M545 Low back pain, unspecified: Secondary | ICD-10-CM | POA: Diagnosis not present

## 2013-12-16 DIAGNOSIS — E119 Type 2 diabetes mellitus without complications: Secondary | ICD-10-CM | POA: Diagnosis not present

## 2013-12-22 ENCOUNTER — Other Ambulatory Visit: Payer: Self-pay | Admitting: Internal Medicine

## 2013-12-22 ENCOUNTER — Telehealth: Payer: Self-pay | Admitting: Radiology

## 2013-12-22 MED ORDER — NEOMYCIN-POLYMYXIN-HC 3.5-10000-1 OT SUSP: 3.0000 [drp] | Freq: Four times a day (QID) | OTIC | Status: DC

## 2013-12-22 NOTE — Telephone Encounter (Signed)
Patient walked in and sat in the lobby. She is asking for ear drops, but we have no ear drops in record for her. Called her pharmacy and they have never filled any ear drops either. Reviewed chart/ do not see drops there either. Dr Laney Pastor has written for her to have cortisporin. Patient indicates she has been using peroxide and will d/c the peroxide and use the cortisporin.

## 2014-01-05 ENCOUNTER — Ambulatory Visit (INDEPENDENT_AMBULATORY_CARE_PROVIDER_SITE_OTHER): Payer: Medicare Other | Admitting: Internal Medicine

## 2014-01-05 ENCOUNTER — Encounter: Payer: Self-pay | Admitting: Internal Medicine

## 2014-01-05 VITALS — BP 148/60 | HR 67 | Temp 97.8°F | Resp 16 | Ht 64.0 in | Wt 157.0 lb

## 2014-01-05 DIAGNOSIS — E1059 Type 1 diabetes mellitus with other circulatory complications: Secondary | ICD-10-CM | POA: Diagnosis not present

## 2014-01-05 DIAGNOSIS — I10 Essential (primary) hypertension: Secondary | ICD-10-CM

## 2014-01-05 DIAGNOSIS — H9209 Otalgia, unspecified ear: Secondary | ICD-10-CM | POA: Diagnosis not present

## 2014-01-05 DIAGNOSIS — R42 Dizziness and giddiness: Secondary | ICD-10-CM

## 2014-01-05 DIAGNOSIS — G44029 Chronic cluster headache, not intractable: Secondary | ICD-10-CM

## 2014-01-05 LAB — CBC WITH DIFFERENTIAL/PLATELET
Basophils Absolute: 0.1 10*3/uL (ref 0.0–0.1)
Basophils Relative: 1 % (ref 0–1)
Eosinophils Absolute: 0.3 10*3/uL (ref 0.0–0.7)
Eosinophils Relative: 6 % — ABNORMAL HIGH (ref 0–5)
HCT: 30.3 % — ABNORMAL LOW (ref 36.0–46.0)
Hemoglobin: 10.7 g/dL — ABNORMAL LOW (ref 12.0–15.0)
Lymphocytes Relative: 34 % (ref 12–46)
Lymphs Abs: 1.8 10*3/uL (ref 0.7–4.0)
MCH: 29.3 pg (ref 26.0–34.0)
MCHC: 35.3 g/dL (ref 30.0–36.0)
MCV: 83 fL (ref 78.0–100.0)
Monocytes Absolute: 0.4 10*3/uL (ref 0.1–1.0)
Monocytes Relative: 7 % (ref 3–12)
Neutro Abs: 2.8 10*3/uL (ref 1.7–7.7)
Neutrophils Relative %: 52 % (ref 43–77)
Platelets: 275 10*3/uL (ref 150–400)
RBC: 3.65 MIL/uL — ABNORMAL LOW (ref 3.87–5.11)
RDW: 13.7 % (ref 11.5–15.5)
WBC: 5.4 10*3/uL (ref 4.0–10.5)

## 2014-01-05 NOTE — Progress Notes (Signed)
Subjective:    Patient ID: Rose Cruz, female    DOB: [birth year], 80 y.o.   MRN: [id number]  This chart was scribed for Leandrew Koyanagi, MD by Edison Simon, ED Scribe. This patient was seen in room 25 and the patient's care was started at 3:45 PM.   HPI  HPI Comments: Rose Cruz is a 80 y.o. female with history of TIA who presents to the [facility] complaining of headache, dizziness, and visual disturbance with onset last night. She states she went to a soccer game, came home and ate a bowl of ice cream, got dizzy and lay down, then got up to use the restroom at which time she reports blurriness to parts of her visual field. She states she then went to the restroom, took some ASA, lay down for a while, then got up to take her blood pressure and found it to be elevated. She states the dizziness and blurred vision was fairly brief, less than half an hour. She reports having a headache for some time since her ear has started aching, but the headache intensified last night. She states she was afraid it was a CVA.   She reports continued ear ache. She reports using ear drops without remission of symptoms. She also reports an insect bite to her left medial lower extremity that is getting better. She denies hearing change, She reports stopping Norvasc because of edema and relatively benign blood pressure, but her blood pressure record shows somewhat elevated measurements. She reports decreased appetite, but has not had significant weight loss. She reports recent exercising riding a bicycle and doing yard work. She reports recurrent chronic headaches that she states had began around the time of onset of ear ache; she states headaches have been progressively more frequent, almost every day now. She states they are not debilitating though and that she is able to function normally, except for last night's. She state that a close friend of hers suddenly passed away.    Patient  Active Problem List   Diagnosis Date Noted  . Varicose veins 01/24/2012  . Hyperlipidemia 01/24/2012  . Anemia-chronic normocytic 01/24/2012  . Colonic polyp-colonoscopy 2004 01/24/2012  . Chronic back pain 01/24/2012  . Microalbuminuria-2008 01/24/2012  . B12 deficiency-2009 01/24/2012  . Full dentures-age 6 01/24/2012  . TIA (transient ischemic attack)-01/13/10 01/24/2012  . BMI 28.0-28.9,adult 01/22/2012  . HTN (hypertension) 01/01/2012  . Diabetes mellitus 01/01/2012     Medication List       This list is accurate as of: 01/05/14  2:49 PM.  Always use your most recent med list.               amLODipine 10 MG tablet  Commonly known as:  NORVASC  TAKE 1 TABLET BY MOUTH DAILY.     aspirin 81 MG tablet  Take 81 mg by mouth daily.     calcium carbonate 600 MG Tabs tablet  Commonly known as:  OS-CAL  Take 600 mg by mouth 2 (two) times daily with a meal.     Ferrous Sulfate Dried 200 (65 FE) MG Tabs  Take by mouth.     fish oil-omega-3 fatty acids 1000 MG capsule  Take 2 g by mouth daily.     losartan-hydrochlorothiazide 100-25 MG per tablet  Commonly known as:  HYZAAR  Take 1 tablet by mouth daily.     lovastatin 10 MG tablet  Commonly known as:  MEVACOR  Take 2 tablets (  20 mg total) by mouth at bedtime.     metFORMIN 1000 MG tablet  Commonly known as:  GLUCOPHAGE  Take 1 tablet (1,000 mg total) by mouth 2 (two) times daily with a meal.     multivitamin tablet  Take 1 tablet by mouth daily.     neomycin-polymyxin-hydrocortisone 3.5-10000-1 otic suspension  Commonly known as:  CORTISPORIN  Place 3 drops into the left ear 4 (four) times daily.     OVER THE COUNTER MEDICATION  Vitamin D 1000iu taking along w/Calciumm 600         Review of Systems  Constitutional: Positive for appetite change. Negative for unexpected weight change (3 pound loss since April).  HENT: Positive for ear pain.   Eyes: Positive for visual disturbance.  Cardiovascular:        Hypertension  Skin: Positive for wound (insect bite).  Neurological: Positive for dizziness and headaches.       Objective:   Physical Exam  Nursing note and vitals reviewed. Constitutional: She is oriented to person, place, and time. She appears well-developed and well-nourished.  HENT:  Head: Normocephalic and atraumatic.  Right Ear: External ear normal.  Left Ear: External ear normal.  TMs normal in appearance  Eyes: Conjunctivae are normal.  Neck: Normal range of motion. Neck supple.  No carotid bruit  Cardiovascular: Normal rate and normal heart sounds.   No murmur heard. Rate approximately 65  Pulmonary/Chest: Effort normal and breath sounds normal. No respiratory distress. She has no wheezes. She has no rales.  Musculoskeletal: Normal range of motion.  Neurological: She is alert and oriented to person, place, and time. She displays normal reflexes (good DTRs to upper and lower extremities).  Skin: Skin is warm and dry.  Psychiatric: She has a normal mood and affect.   Wt Readings from Last 3 Encounters:  01/05/14 157 lb (71.215 kg)  08/04/13 160 lb 9.6 oz (72.848 kg)  08/04/13 161 lb (73.029 kg)  BP 148/60  Pulse 67  Temp(Src) 97.8 F (36.6 C) (Oral)  Resp 16  Ht 5' 4"  (1.626 m)  Wt 157 lb (71.215 kg)  BMI 26.94 kg/m2  SpO2 99%       Assessment & Plan:   I have completed the patient encounter in its entirety as documented by the scribe, with editing by me where necessary. Shakeem Stern P. Laney Pastor, M.D.   Unspecified essential hypertension -?loss of control  Type II diabetes mellitus -controlled(250.00) -   Dizzy -follow--home BPs and pulses  Otalgia, unspecified laterality   HA-not intractable//? etio--- with no neurological findings on exam we will follow this symptom for now since she feels good today    Addendum:  Results for orders placed in visit on 01/05/14  CBC WITH DIFFERENTIAL      Result Value Ref Range   WBC 5.4  4.0 - 10.5 K/uL   RBC  3.65 (*) 3.87 - 5.11 MIL/uL   Hemoglobin------ chronic and continuing  10.7 (*) 12.0 - 15.0 g/dL   HCT 30.3 (*) 36.0 - 46.0 %   MCV 83.0  78.0 - 100.0 fL   MCH 29.3  26.0 - 34.0 pg   MCHC 35.3  30.0 - 36.0 g/dL   RDW 13.7  11.5 - 15.5 %   Platelets 275  150 - 400 K/uL   Neutrophils Relative % 52  43 - 77 %   Neutro Abs 2.8  1.7 - 7.7 K/uL   Lymphocytes Relative 34  12 - 46 %  Lymphs Abs 1.8  0.7 - 4.0 K/uL   Monocytes Relative 7  3 - 12 %   Monocytes Absolute 0.4  0.1 - 1.0 K/uL   Eosinophils Relative 6 (*) 0 - 5 %   Eosinophils Absolute 0.3  0.0 - 0.7 K/uL   Basophils Relative 1  0 - 1 %   Basophils Absolute 0.1  0.0 - 0.1 K/uL   Smear Review Criteria for review not met    COMPLETE METABOLIC PANEL WITH GFR      Result Value Ref Range   Sodium 133 (*) 135 - 145 mEq/L   Potassium 4.4  3.5 - 5.3 mEq/L   Chloride 97  96 - 112 mEq/L   CO2 26  19 - 32 mEq/L   Glucose, Bld 92  70 - 99 mg/dL   BUN 20  6 - 23 mg/dL   Creat-----this trend continues since 2013  1.27 (*) 0.50 - 1.10 mg/dL   Total Bilirubin 0.3  0.2 - 1.2 mg/dL   Alkaline Phosphatase 65  39 - 117 U/L   AST 22  0 - 37 U/L   ALT 18  0 - 35 U/L   Total Protein 6.4  6.0 - 8.3 g/dL   Albumin 3.9  3.5 - 5.2 g/dL   Calcium 9.6  8.4 - 10.5 mg/dL   GFR, Est African American 47 (*)    GFR, Est Non African American 41 (*)   LIPID PANEL      Result Value Ref Range   Cholesterol-----opposed to statins  220 (*) 0 - 200 mg/dL   Triglycerides 132  <150 mg/dL   HDL 52  >39 mg/dL   Total CHOL/HDL Ratio 4.2     VLDL 26  0 - 40 mg/dL   LDL Cholesterol 142 (*) 0 - 99 mg/dL  TSH      Result Value Ref Range   TSH 1.146  0.350 - 4.500 uIU/mL  HEMOGLOBIN A1C      Result Value Ref Range   Hemoglobin A1C-----great ///she continues with excellent control  6.5 (*) <5.7 %   Mean Plasma Glucose 140 (*) <117 mg/dL

## 2014-01-06 LAB — TSH: TSH: 1.146 u[IU]/mL (ref 0.350–4.500)

## 2014-01-06 LAB — COMPLETE METABOLIC PANEL WITH GFR
ALT: 18 U/L (ref 0–35)
AST: 22 U/L (ref 0–37)
Albumin: 3.9 g/dL (ref 3.5–5.2)
Alkaline Phosphatase: 65 U/L (ref 39–117)
BUN: 20 mg/dL (ref 6–23)
CO2: 26 mEq/L (ref 19–32)
Calcium: 9.6 mg/dL (ref 8.4–10.5)
Chloride: 97 mEq/L (ref 96–112)
Creat: 1.27 mg/dL — ABNORMAL HIGH (ref 0.50–1.10)
GFR, Est African American: 47 mL/min — ABNORMAL LOW
GFR, Est Non African American: 41 mL/min — ABNORMAL LOW
Glucose, Bld: 92 mg/dL (ref 70–99)
Potassium: 4.4 mEq/L (ref 3.5–5.3)
Sodium: 133 mEq/L — ABNORMAL LOW (ref 135–145)
Total Bilirubin: 0.3 mg/dL (ref 0.2–1.2)
Total Protein: 6.4 g/dL (ref 6.0–8.3)

## 2014-01-06 LAB — LIPID PANEL
Cholesterol: 220 mg/dL — ABNORMAL HIGH (ref 0–200)
HDL: 52 mg/dL (ref >39)
LDL Cholesterol: 142 mg/dL — ABNORMAL HIGH (ref 0–99)
Total CHOL/HDL Ratio: 4.2 Ratio
Triglycerides: 132 mg/dL (ref <150)
VLDL: 26 mg/dL (ref 0–40)

## 2014-01-06 LAB — HEMOGLOBIN A1C
Hgb A1c MFr Bld: 6.5 % — ABNORMAL HIGH (ref <5.7)
Mean Plasma Glucose: 140 mg/dL — ABNORMAL HIGH (ref <117)

## 2014-01-07 ENCOUNTER — Encounter: Payer: Self-pay | Admitting: Internal Medicine

## 2014-02-02 ENCOUNTER — Ambulatory Visit (INDEPENDENT_AMBULATORY_CARE_PROVIDER_SITE_OTHER): Payer: Medicare Other | Admitting: Internal Medicine

## 2014-02-02 ENCOUNTER — Encounter: Payer: Self-pay | Admitting: Internal Medicine

## 2014-02-02 VITALS — BP 184/69 | HR 87 | Temp 98.1°F | Resp 16 | Ht 64.0 in | Wt 157.0 lb

## 2014-02-02 DIAGNOSIS — I10 Essential (primary) hypertension: Secondary | ICD-10-CM | POA: Diagnosis not present

## 2014-02-02 DIAGNOSIS — E119 Type 2 diabetes mellitus without complications: Secondary | ICD-10-CM

## 2014-02-02 MED ORDER — DILTIAZEM HCL ER COATED BEADS 120 MG PO CP24: 120.0000 mg | ORAL_CAPSULE | Freq: Every day | ORAL | Status: DC

## 2014-02-02 MED ORDER — DILTIAZEM HCL ER COATED BEADS 180 MG PO CP24: 180.0000 mg | ORAL_CAPSULE | Freq: Every day | ORAL | Status: DC

## 2014-02-02 NOTE — Progress Notes (Signed)
Subjective:    Patient ID: Rose Cruz, female    DOB: 06-Mar-1933, 80 y.o.   MRN: [id number]  HPI  This is a 80 year old female with PMH TIA, HTN, DM2, HLD, anemia and chronic back pain who is here for follow up of her blood pressure. At last visit, she had been having frequent headaches and one incident of blurred vision and realized her blood pressure was elevated. Patient reports her headaches are better. Headaches are still happening but less severe and not lasting as long. Location is on the top of her head, not localized to one side. She has not had problems with her vision since the previous incident. Ear pain is better - she has only had one incident of pain that improved with "sweet oil".  Blood pressures have continued to be high at home. She states usually the first time she takes her BP it's higher than the second time she takes it. Her BP record shows systolic BP consistently 0000000, and diastolic 99991111. She has had edema with amlodipine in the past.  Bug bite on medial left leg is improved but lesion is not gone yet. Patient Active Problem List   Diagnosis Date Noted  . HTN (hypertension) 01/01/2012    Priority: Medium  . Diabetes mellitus 01/01/2012    Priority: Medium  . Varicose veins 01/24/2012  . Hyperlipidemia 01/24/2012  . Anemia-chronic normocytic 01/24/2012  . Colonic polyp-colonoscopy 2004 01/24/2012  . Chronic back pain 01/24/2012  . Microalbuminuria-2008 01/24/2012  . B12 deficiency-2009 01/24/2012  . Full dentures-age 38 01/24/2012  . TIA (transient ischemic attack)-01/13/10 01/24/2012  . BMI 28.0-28.9,adult 01/22/2012   Prior to Admission medications   Medication Sig Start Date End Date Taking? Authorizing Provider  aspirin 81 MG tablet Take 81 mg by mouth daily.   Yes Historical Provider, MD  Ferrous Sulfate Dried 200 (65 FE) MG TABS Take by mouth.   Yes Historical Provider, MD  losartan-hydrochlorothiazide (HYZAAR) 100-25 MG per tablet Take 1 tablet  by mouth daily. 09/09/13  Yes Leandrew Koyanagi, MD  metFORMIN (GLUCOPHAGE) 1000 MG tablet Take 1 tablet (1,000 mg total) by mouth 2 (two) times daily with a meal. 09/09/13  Yes Leandrew Koyanagi, MD  Multiple Vitamin (MULTIVITAMIN) tablet Take 1 tablet by mouth daily.   Yes Historical Provider, MD  OVER THE COUNTER MEDICATION Vitamin D 1000iu taking along w/Calciumm 600   Yes Historical Provider, MD   Needs flu/prevnar but defers due to trip tomorrow Still defers colonos  Eye exam due next  Review of Systems  Constitutional: Negative for unexpected weight change.  Eyes: Negative for visual disturbance.  Respiratory: Negative for shortness of breath.   Cardiovascular: Negative for chest pain, palpitations and leg swelling.  Gastrointestinal: Negative for abdominal pain.  Genitourinary: Negative for difficulty urinating.  Neurological: Negative for dizziness and weakness.  Psychiatric/Behavioral: Negative for dysphoric mood.       Objective:   Physical Exam  Nursing note and vitals reviewed. Constitutional: She is oriented to person, place, and time. She appears well-developed and well-nourished. No distress.  HENT:  Head: Normocephalic and atraumatic.  Eyes: Pupils are equal, round, and reactive to light.  Neck: Normal range of motion.  Cardiovascular: Normal rate and regular rhythm.   Pulmonary/Chest: Effort normal. No respiratory distress.  Musculoskeletal: Normal range of motion.  Neurological: She is alert and oriented to person, place, and time.  Skin: Skin is warm and dry.  Psychiatric: She has a normal mood and  affect. Her behavior is normal.  BP 184/69  Pulse 87  Temp(Src) 98.1 F (36.7 C)  Resp 16  Ht 5\' 4"  (1.626 m)  Wt 157 lb (71.215 kg)  BMI 26.94 kg/m2  SpO2 98% Repeat still 170/        Assessment & Plan:  I have participated in the care of this patient with the Advanced Practice Provider and agree with Diagnosis and Plan as documented. Robert P.  Laney Pastor, M.D.  Essential hypertension  Not well controlled Type 2 diabetes mellitus without complication  HAs--improving/stable for now  Hyperlip--no meds her choice  Plan--low dose diltiaz 120CDand if edema she can call and we'll try hydralazine or alpha blocker F/u 2 mos--send in BP 1-2 weeks Flu shot next week prevnar at f/u

## 2014-02-10 ENCOUNTER — Ambulatory Visit (INDEPENDENT_AMBULATORY_CARE_PROVIDER_SITE_OTHER): Payer: Medicare Other

## 2014-02-10 DIAGNOSIS — Z23 Encounter for immunization: Secondary | ICD-10-CM

## 2014-03-05 ENCOUNTER — Other Ambulatory Visit: Payer: Self-pay | Admitting: Internal Medicine

## 2014-03-16 ENCOUNTER — Telehealth: Payer: Self-pay | Admitting: Family Medicine

## 2014-03-16 ENCOUNTER — Other Ambulatory Visit: Payer: Self-pay | Admitting: Internal Medicine

## 2014-03-16 DIAGNOSIS — I10 Essential (primary) hypertension: Secondary | ICD-10-CM

## 2014-03-16 MED ORDER — VERAPAMIL HCL ER 180 MG PO TBCR: 180.0000 mg | EXTENDED_RELEASE_TABLET | Freq: Every day | ORAL | Status: DC

## 2014-03-16 NOTE — Telephone Encounter (Signed)
Patient is in office, stated that she could not get through on the phone,  She is concerned that he new blood pressure medicine diltiazem is causing her bp to go up and down.  She also stated that her skin seems to be bruising easier and tearing with the smallest scrapes.  Please advise.

## 2014-03-16 NOTE — Progress Notes (Signed)
bp home with dias <75 and sys mostly over 150 on diltia 120---also c/o easy bruising now Will change Meds ordered this encounter  Medications  . verapamil (CALAN-SR) 180 MG CR tablet    Sig: Take 1 tablet (180 mg total) by mouth at bedtime.    Dispense:  90 tablet    Refill:  1   Cont home bp contact 1 mo

## 2014-04-06 ENCOUNTER — Ambulatory Visit (INDEPENDENT_AMBULATORY_CARE_PROVIDER_SITE_OTHER): Payer: Medicare Other | Admitting: Internal Medicine

## 2014-04-06 ENCOUNTER — Ambulatory Visit (INDEPENDENT_AMBULATORY_CARE_PROVIDER_SITE_OTHER): Payer: Medicare Other

## 2014-04-06 ENCOUNTER — Encounter: Payer: Self-pay | Admitting: Internal Medicine

## 2014-04-06 VITALS — BP 160/66 | HR 65 | Temp 97.5°F | Resp 16 | Ht 63.5 in | Wt 154.0 lb

## 2014-04-06 DIAGNOSIS — M79675 Pain in left toe(s): Secondary | ICD-10-CM | POA: Diagnosis not present

## 2014-04-06 DIAGNOSIS — Z23 Encounter for immunization: Secondary | ICD-10-CM

## 2014-04-06 DIAGNOSIS — E119 Type 2 diabetes mellitus without complications: Secondary | ICD-10-CM

## 2014-04-06 DIAGNOSIS — I10 Essential (primary) hypertension: Secondary | ICD-10-CM | POA: Diagnosis not present

## 2014-04-06 DIAGNOSIS — Z1211 Encounter for screening for malignant neoplasm of colon: Secondary | ICD-10-CM

## 2014-04-06 NOTE — Progress Notes (Signed)
   Subjective:    Patient ID: Rose Cruz, female    DOB: 04-14-1933, 80 y.o.   MRN: [id number]  HPI  Chief Complaint  Patient presents with  . Follow-up---BP---home reading w/ downward trend til 130/60 this am x 2 Several reading s in 140s last week  . Ear Pain---responded to sweet oil  . Toe Pain--dropped mattress on toe--swollen, red, tender   Hurts to bear wt Some bruising of skin-?related to chg BP med--this too has stabilized  Patient Active Problem List   Diagnosis Date Noted  . HTN (hypertension) 01/01/2012    Priority: Medium  . Diabetes mellitus 01/01/2012    Priority: Medium  . Varicose veins 01/24/2012  . Hyperlipidemia 01/24/2012  . Anemia-chronic normocytic 01/24/2012  . Colonic polyp-colonoscopy 2004 01/24/2012  . Chronic back pain 01/24/2012  . Microalbuminuria-2008 01/24/2012  . B12 deficiency-2009 01/24/2012  . Full dentures-age 44 01/24/2012  . TIA (transient ischemic attack)-01/13/10 01/24/2012  . BMI 28.0-28.9,adult 01/22/2012   Prior to Admission medications   Medication Sig Start Date End Date Taking? Authorizing Provider  aspirin 81 MG tablet Take 81 mg by mouth daily.   Yes Historical Provider, MD  Ferrous Sulfate Dried 200 (65 FE) MG TABS Take by mouth.   Yes Historical Provider, MD  losartan-hydrochlorothiazide (HYZAAR) 100-25 MG per tablet TAKE 1 TABLET BY MOUTH DAILY. 03/06/14  Yes Mikala Alleen Borne, PA-C  metFORMIN (GLUCOPHAGE) 1000 MG tablet TAKE 1 TABLET BY MOUTH 2 TIMES DAILY WITH A MEAL. 03/06/14  Yes Mancel Bale, PA-C  Multiple Vitamin (MULTIVITAMIN) tablet Take 1 tablet by mouth daily.   Yes Historical Provider, MD  OVER THE COUNTER MEDICATION Vitamin D 1000iu taking along w/Calciumm 600   Yes Historical Provider, MD  verapamil (CALAN-SR) 180 MG CR tablet Take 1 tablet (180 mg total) by mouth at bedtime. 03/16/14  Yes Leandrew Koyanagi, MD  diltiazem (CARDIZEM CD) 120 MG 24 hr capsule Take 1 capsule (120 mg total) by mouth daily. Patient  not taking: Reported on 04/06/2014 02/02/14 replasced w/ verap 12/2 and not c/o sideeffects now  Leandrew Koyanagi, MD     Adult vaccines due  Topic Date Due  . PNEUMOCOCCAL POLYSACCHARIDE VACCINE AGE 39 AND OVER  02/24/2000  . TETANUS/TDAP  07/24/2023  . ZOSTAVAX  Completed  eye exam-Dr Delman Cheadle spring '15 and all OK    Review of Systems No fever/wt loss--good app No sob/doe No palp/CP No unsteadiness of gait   NO EDEMA since med change Objective:   Physical Exam  BP 160/66 mmHg  Pulse 65  Temp(Src) 97.5 F (36.4 C) (Oral)  Resp 16  Ht 5' 3.5" (1.613 m)  Wt 154 lb (69.854 kg)  BMI 26.85 kg/m2  SpO2 98% HEENT clear Ht reg L 5th toe swollen/tender-good align-pain w/ rom incl MTP seb ker arms Diabetic Foot Exam - Simple   Intact sensory     [facility] reading (PRIMARY) by  Dr. Laney Pastor Fx at prox end of prox phalanx in good position        Assessment & Plan:  Fx toe--buddy tape AODM--no chg(verap 180 new) HTN--continue same meds/home monitor/f/u 3 mos prevnar due--given today  F/u 3 mos for A1c/labs

## 2014-04-29 ENCOUNTER — Telehealth: Payer: Self-pay

## 2014-04-29 ENCOUNTER — Encounter: Payer: Self-pay | Admitting: Internal Medicine

## 2014-04-29 NOTE — Telephone Encounter (Signed)
Derm [facility] or [facility]afe[facility]

## 2014-04-29 NOTE — Telephone Encounter (Signed)
Patient is wanting to know if she needs a colonoscopy, and would like to know which dermatologist Dr Laney Pastor would refer her out to see.   Best#: [phone]

## 2014-05-02 NOTE — Telephone Encounter (Signed)
LM for pt with Dr. Allyson Sabal information.

## 2014-05-08 ENCOUNTER — Telehealth: Payer: Self-pay | Admitting: *Deleted

## 2014-05-08 DIAGNOSIS — Z1211 Encounter for screening for malignant neoplasm of colon: Secondary | ICD-10-CM

## 2014-05-08 NOTE — Telephone Encounter (Signed)
If she has never had one(colonoscopy)-then one per lifetime recommended before age 79

## 2014-05-10 ENCOUNTER — Telehealth: Payer: Self-pay | Admitting: *Deleted

## 2014-05-10 NOTE — Telephone Encounter (Signed)
Dr.  Laney Pastor,  Patient wants to know if she should have an colonoscopy.  Pt # [phone]

## 2014-05-10 NOTE — Telephone Encounter (Signed)
Tried to reach pt- no answer on home number

## 2014-05-10 NOTE — Telephone Encounter (Signed)
No! At 27 this is not needed for someone at average risk. She should instead do hemosure at home. Please mail her a kit to send back

## 2014-05-12 NOTE — Telephone Encounter (Signed)
LMOM to call me back, I will just send a letter to pt's home with hemosure kit. Done.

## 2014-05-20 ENCOUNTER — Other Ambulatory Visit (INDEPENDENT_AMBULATORY_CARE_PROVIDER_SITE_OTHER): Payer: Medicare Other | Admitting: *Deleted

## 2014-05-20 DIAGNOSIS — Z1211 Encounter for screening for malignant neoplasm of colon: Secondary | ICD-10-CM

## 2014-05-20 DIAGNOSIS — R195 Other fecal abnormalities: Secondary | ICD-10-CM

## 2014-05-20 LAB — IFOBT (OCCULT BLOOD): IFOBT: POSITIVE

## 2014-05-20 NOTE — Addendum Note (Signed)
Addended by: Leandrew Koyanagi on: 05/20/2014 11:51 AM   Modules accepted: Orders

## 2014-05-20 NOTE — Progress Notes (Signed)
Pos heme stool ref colonos

## 2014-05-20 NOTE — Addendum Note (Signed)
Addended byGrant Fontana R on: 05/20/2014 11:19 AM   Modules accepted: Orders

## 2014-06-02 DIAGNOSIS — L570 Actinic keratosis: Secondary | ICD-10-CM | POA: Diagnosis not present

## 2014-06-02 DIAGNOSIS — L814 Other melanin hyperpigmentation: Secondary | ICD-10-CM | POA: Diagnosis not present

## 2014-06-02 DIAGNOSIS — L821 Other seborrheic keratosis: Secondary | ICD-10-CM | POA: Diagnosis not present

## 2014-06-02 DIAGNOSIS — D225 Melanocytic nevi of trunk: Secondary | ICD-10-CM | POA: Diagnosis not present

## 2014-06-02 DIAGNOSIS — L820 Inflamed seborrheic keratosis: Secondary | ICD-10-CM | POA: Diagnosis not present

## 2014-08-15 ENCOUNTER — Other Ambulatory Visit: Payer: Self-pay | Admitting: Dermatology

## 2014-08-15 DIAGNOSIS — L568 Other specified acute skin changes due to ultraviolet radiation: Secondary | ICD-10-CM | POA: Diagnosis not present

## 2014-08-15 DIAGNOSIS — D485 Neoplasm of uncertain behavior of skin: Secondary | ICD-10-CM | POA: Diagnosis not present

## 2014-08-23 ENCOUNTER — Telehealth: Payer: Self-pay

## 2014-08-23 NOTE — Telephone Encounter (Signed)
Rec'd records from [place] Forwarding 8pgs to [facility]

## 2014-09-02 ENCOUNTER — Other Ambulatory Visit: Payer: Self-pay | Admitting: Physician Assistant

## 2014-09-15 DIAGNOSIS — L570 Actinic keratosis: Secondary | ICD-10-CM | POA: Diagnosis not present

## 2014-09-19 DIAGNOSIS — R195 Other fecal abnormalities: Secondary | ICD-10-CM | POA: Diagnosis not present

## 2014-10-12 ENCOUNTER — Ambulatory Visit (INDEPENDENT_AMBULATORY_CARE_PROVIDER_SITE_OTHER): Payer: Medicare Other | Admitting: Internal Medicine

## 2014-10-12 ENCOUNTER — Encounter: Payer: Self-pay | Admitting: Internal Medicine

## 2014-10-12 VITALS — BP 174/60 | HR 66 | Temp 97.4°F | Resp 16 | Ht 64.0 in | Wt 152.4 lb

## 2014-10-12 DIAGNOSIS — D649 Anemia, unspecified: Secondary | ICD-10-CM | POA: Diagnosis not present

## 2014-10-12 DIAGNOSIS — E119 Type 2 diabetes mellitus without complications: Secondary | ICD-10-CM

## 2014-10-12 DIAGNOSIS — I10 Essential (primary) hypertension: Secondary | ICD-10-CM

## 2014-10-12 LAB — CBC WITH DIFFERENTIAL/PLATELET
Basophils Absolute: 0 10*3/uL (ref 0.0–0.1)
Basophils Relative: 1 % (ref 0–1)
Eosinophils Absolute: 0.4 10*3/uL (ref 0.0–0.7)
Eosinophils Relative: 8 % — ABNORMAL HIGH (ref 0–5)
HCT: 29.1 % — ABNORMAL LOW (ref 36.0–46.0)
Hemoglobin: 10.3 g/dL — ABNORMAL LOW (ref 12.0–15.0)
Lymphocytes Relative: 33 % (ref 12–46)
Lymphs Abs: 1.5 10*3/uL (ref 0.7–4.0)
MCH: 28.4 pg (ref 26.0–34.0)
MCHC: 35.4 g/dL (ref 30.0–36.0)
MCV: 80.2 fL (ref 78.0–100.0)
MPV: 8.6 fL (ref 8.6–12.4)
Monocytes Absolute: 0.3 10*3/uL (ref 0.1–1.0)
Monocytes Relative: 7 % (ref 3–12)
Neutro Abs: 2.2 10*3/uL (ref 1.7–7.7)
Neutrophils Relative %: 51 % (ref 43–77)
Platelets: 286 10*3/uL (ref 150–400)
RBC: 3.63 MIL/uL — ABNORMAL LOW (ref 3.87–5.11)
RDW: 13.8 % (ref 11.5–15.5)
WBC: 4.4 10*3/uL (ref 4.0–10.5)

## 2014-10-12 LAB — COMPREHENSIVE METABOLIC PANEL
ALT: 15 U/L (ref 0–35)
AST: 18 U/L (ref 0–37)
Albumin: 3.9 g/dL (ref 3.5–5.2)
Alkaline Phosphatase: 80 U/L (ref 39–117)
BUN: 22 mg/dL (ref 6–23)
CO2: 26 mEq/L (ref 19–32)
Calcium: 9.2 mg/dL (ref 8.4–10.5)
Chloride: 100 mEq/L (ref 96–112)
Creat: 1.51 mg/dL — ABNORMAL HIGH (ref 0.50–1.10)
Glucose, Bld: 96 mg/dL (ref 70–99)
Potassium: 4.6 mEq/L (ref 3.5–5.3)
Sodium: 138 mEq/L (ref 135–145)
Total Bilirubin: 0.3 mg/dL (ref 0.2–1.2)
Total Protein: 6.3 g/dL (ref 6.0–8.3)

## 2014-10-12 LAB — IRON AND TIBC
%SAT: 24 % (ref 20–55)
Iron: 71 ug/dL (ref 42–145)
TIBC: 291 ug/dL (ref 250–470)
UIBC: 220 ug/dL (ref 125–400)

## 2014-10-12 LAB — POCT GLYCOSYLATED HEMOGLOBIN (HGB A1C): Hemoglobin A1C: 6.3

## 2014-10-12 MED ORDER — VERAPAMIL HCL ER 180 MG PO TBCR: 180.0000 mg | EXTENDED_RELEASE_TABLET | Freq: Every day | ORAL | Status: DC

## 2014-10-12 MED ORDER — LOSARTAN POTASSIUM-HCTZ 100-25 MG PO TABS: 1.0000 | ORAL_TABLET | Freq: Every day | ORAL | Status: DC

## 2014-10-12 NOTE — Progress Notes (Signed)
Subjective:    Patient ID: Rose Cruz, female    DOB: 08-14-1932, 81 y.o.   MRN: [id number]  HPI 3 weeks ago she had sudden onset diarrhea several times a day--barely slowed by immod//fever nausea or vomiting and no abdominal pain. No blood in stool. This lasted for several days and just resolved last week. Her appetite was diminished for while although now seems back to normal.  Face lesions-dr lupton pathology benign/she was not happy due to personality clash/still some tenderness of the lip lesion which she is addressing with topical emollients. She has seen Dr. Nevada Crane many years ago and would like to see him for longer-term follow-up of her multiple skin problems.  Tired often as before///activity level is low in general.  Patient Active Problem List   Diagnosis Date Noted  . HTN (hypertension) 01/01/2012    Priority: Medium  . Diabetes mellitus 01/01/2012    Priority: Medium  . Varicose veins 01/24/2012  . Hyperlipidemia 01/24/2012  . Anemia-chronic normocytic 01/24/2012  . Colonic polyp-colonoscopy 2004 01/24/2012  . Chronic back pain 01/24/2012  . Microalbuminuria-2008 01/24/2012  . B12 deficiency-2009 01/24/2012  . Full dentures-age 45 01/24/2012  . TIA (transient ischemic attack)-01/13/10 01/24/2012  . BMI 28.0-28.9,adult 01/22/2012    Current outpatient prescriptions:  .  aspirin 81 MG tablet, Take 81 mg by mouth daily., Disp: , Rfl:  .  losartan-hydrochlorothiazide (HYZAAR) 100-25 MG per tablet, TAKE 1 TABLET BY MOUTH DAILY., Disp: 90 tablet, Rfl: 0 .  metFORMIN (GLUCOPHAGE) 1000 MG tablet, TAKE 1 TABLET BY MOUTH 2 TIMES DAILY WITH A MEAL., Disp: 180 tablet, Rfl: 0 .  Multiple Vitamin (MULTIVITAMIN) tablet, Take 1 tablet by mouth daily., Disp: , Rfl:  .  OVER THE COUNTER MEDICATION, 65 mg daily., Disp: , Rfl:  .  verapamil (CALAN-SR) 180 MG CR tablet, Take 1 tablet (180 mg total) by mouth at bedtime., Disp: 90 tablet, Rfl: 1 .  diltiazem (CARDIZEM CD) 120 MG 24 hr  capsule, Take 1 capsule (120 mg total) by mouth daily. (Patient not taking: Reported on 04/06/2014), Disp: 90 capsule, Rfl: 1 .  Ferrous Sulfate Dried 200 (65 FE) MG TABS, Take by mouth., Disp: , Rfl:  .  OVER THE COUNTER MEDICATION, Vitamin D 1000iu taking along w/Calciumm 600, Disp: , Rfl:     Eye exam yearly Dr Delman Cheadle ?bone density never tho shes not sure she wants this colonos for 11/15/14 Dr hayes(polyp 2004) No sequelae from TIA 2011 Past history of B12 deficiency but normal at most recent test 2014  Review of Systems  Constitutional:       No fever chills or night sweats  Eyes: Negative for visual disturbance.  Respiratory: Negative for chest tightness and shortness of breath.   Cardiovascular: Negative for chest pain, palpitations and leg swelling.  Genitourinary: Negative for urgency and difficulty urinating.  Neurological: Negative for tremors, speech difficulty, weakness and headaches.  Psychiatric/Behavioral: Negative for behavioral problems and sleep disturbance.   Wt Readings from Last 3 Encounters:  10/12/14 152 lb 6.4 oz (69.128 kg)  04/06/14 154 lb (69.854 kg)  02/02/14 157 lb (71.215 kg)        Objective:   Physical Exam  Constitutional: She is oriented to person, place, and time. She appears well-developed and well-nourished.  Eyes: EOM are normal. Pupils are equal, round, and reactive to light.  Neck: No thyromegaly present.  Cardiovascular: Normal rate, regular rhythm, normal heart sounds and intact distal pulses.   No murmur heard. Pulmonary/Chest:  Effort normal and breath sounds normal.  Musculoskeletal: She exhibits no edema.  Neurological: She is alert and oriented to person, place, and time. She has normal reflexes. No cranial nerve deficit.  Skin:  She has a small scar along the [place] border of the lower lip without inflammation or vesiculation though it is slightly tender to palpation She has a number of skin lesions on her face and arms  consistent with actinic keratoses and aging skin which will need follow-up  Psychiatric: She has a normal mood and affect. Her behavior is normal. Thought content normal.   no evidence of neuropathy on diabetic foot exam     Assessment & Plan:  Type 2 diabetes mellitus without complication -   Essential hypertension - Plan: Comprehensive metabolic panel  Anemia, unspecified anemia type - Plan: CBC with Differential/Platelet, Iron and TIBC  Skin lesions--Ref Dr Nevada Crane for long term f/u  Hyperlipidemia-statin intolerant--- will repeat lipids in 6 months  We'll refill medicines after lab results  Addend-labs 6/30 Results for orders placed or performed in visit on 10/12/14  Microalbumin, urine  Result Value Ref Range   Microalb, Ur 56.1 (H) <2.0 mg/dL  CBC with Differential/Platelet  Result Value Ref Range   WBC 4.4 4.0 - 10.5 K/uL   RBC 3.63 (L) 3.87 - 5.11 MIL/uL   Hemoglobin 10.3 (L) 12.0 - 15.0 g/dL   HCT 29.1 (L) 36.0 - 46.0 %   MCV 80.2 78.0 - 100.0 fL   MCH 28.4 26.0 - 34.0 pg   MCHC 35.4 30.0 - 36.0 g/dL   RDW 13.8 11.5 - 15.5 %   Platelets 286 150 - 400 K/uL   MPV 8.6 8.6 - 12.4 fL   Neutrophils Relative % 51 43 - 77 %   Neutro Abs 2.2 1.7 - 7.7 K/uL   Lymphocytes Relative 33 12 - 46 %   Lymphs Abs 1.5 0.7 - 4.0 K/uL   Monocytes Relative 7 3 - 12 %   Monocytes Absolute 0.3 0.1 - 1.0 K/uL   Eosinophils Relative 8 (H) 0 - 5 %   Eosinophils Absolute 0.4 0.0 - 0.7 K/uL   Basophils Relative 1 0 - 1 %   Basophils Absolute 0.0 0.0 - 0.1 K/uL   Smear Review Criteria for review not met   Comprehensive metabolic panel  Result Value Ref Range   Sodium 138 135 - 145 mEq/L   Potassium 4.6 3.5 - 5.3 mEq/L   Chloride 100 96 - 112 mEq/L   CO2 26 19 - 32 mEq/L   Glucose, Bld 96 70 - 99 mg/dL   BUN 22 6 - 23 mg/dL   Creat 1.51 (H) 0.50 - 1.10 mg/dL   Total Bilirubin 0.3 0.2 - 1.2 mg/dL   Alkaline Phosphatase 80 39 - 117 U/L   AST 18 0 - 37 U/L   ALT 15 0 - 35 U/L   Total  Protein 6.3 6.0 - 8.3 g/dL   Albumin 3.9 3.5 - 5.2 g/dL   Calcium 9.2 8.4 - 10.5 mg/dL  Iron and TIBC  Result Value Ref Range   Iron 71 42 - 145 ug/dL   UIBC 220 125 - 400 ug/dL   TIBC 291 250 - 470 ug/dL   %SAT 24 20 - 55 %  POCT glycosylated hemoglobin (Hb A1C)  Result Value Ref Range   Hemoglobin A1C 6.3     In view of her consistent low A1c and now with a slight increasing creatinine we will decrease her  metformin by 50%. At 62 she does not need this tight control////she will increase her activity level to see if part of her fatigue is from deconditioning. No change in the blood pressure medicines. There is still no evidence for the cause of her consistent normocytic anemia. Colonoscopy is pending.

## 2014-10-13 ENCOUNTER — Encounter: Payer: Self-pay | Admitting: Internal Medicine

## 2014-10-13 LAB — MICROALBUMIN, URINE: Microalb, Ur: 56.1 mg/dL — ABNORMAL HIGH (ref <2.0)

## 2014-10-13 MED ORDER — METFORMIN HCL 500 MG PO TABS: 500.0000 mg | ORAL_TABLET | Freq: Two times a day (BID) | ORAL | Status: DC

## 2014-10-28 ENCOUNTER — Telehealth: Payer: Self-pay

## 2014-10-28 DIAGNOSIS — L989 Disorder of the skin and subcutaneous tissue, unspecified: Secondary | ICD-10-CM

## 2014-10-28 NOTE — Telephone Encounter (Signed)
Lmom to call back to see why pt need dermatology referral.

## 2014-10-28 NOTE — Telephone Encounter (Signed)
Once I spoke to pt I realized she discussed dermatology with you during her last office visit. She would prefer to see someone other than Dr. Allyson Sabal. She has previously seen Dr. Nevada Crane and okay with him. Okay to put in referral to see Dr. Nevada Crane?

## 2014-10-28 NOTE — Telephone Encounter (Signed)
Patient requesting a referral to Dr hall Dermatolgist. She is unable to go the week of  August 1-5th and she can not go on Wednesdays.  Patient's call back number is [phone]  Dermatologist in [place], [place]  Address: [address] [place], [place], [place] [address]  Phone: [phone]

## 2014-10-31 NOTE — Telephone Encounter (Signed)
Pt.notified

## 2014-11-08 DIAGNOSIS — L570 Actinic keratosis: Secondary | ICD-10-CM | POA: Diagnosis not present

## 2014-11-08 DIAGNOSIS — L568 Other specified acute skin changes due to ultraviolet radiation: Secondary | ICD-10-CM | POA: Diagnosis not present

## 2014-11-08 DIAGNOSIS — L309 Dermatitis, unspecified: Secondary | ICD-10-CM | POA: Diagnosis not present

## 2014-11-08 DIAGNOSIS — X32XXXA Exposure to sunlight, initial encounter: Secondary | ICD-10-CM | POA: Diagnosis not present

## 2014-11-15 DIAGNOSIS — R195 Other fecal abnormalities: Secondary | ICD-10-CM | POA: Diagnosis not present

## 2014-11-15 DIAGNOSIS — K5730 Diverticulosis of large intestine without perforation or abscess without bleeding: Secondary | ICD-10-CM | POA: Diagnosis not present

## 2014-11-29 DIAGNOSIS — T07 Unspecified multiple injuries: Secondary | ICD-10-CM | POA: Diagnosis not present

## 2014-11-29 DIAGNOSIS — L568 Other specified acute skin changes due to ultraviolet radiation: Secondary | ICD-10-CM | POA: Diagnosis not present

## 2014-11-29 DIAGNOSIS — L570 Actinic keratosis: Secondary | ICD-10-CM | POA: Diagnosis not present

## 2014-11-29 DIAGNOSIS — X32XXXD Exposure to sunlight, subsequent encounter: Secondary | ICD-10-CM | POA: Diagnosis not present

## 2014-12-06 DIAGNOSIS — C4401 Basal cell carcinoma of skin of lip: Secondary | ICD-10-CM | POA: Diagnosis not present

## 2014-12-16 DIAGNOSIS — E119 Type 2 diabetes mellitus without complications: Secondary | ICD-10-CM | POA: Diagnosis not present

## 2014-12-16 LAB — HM DIABETES EYE EXAM

## 2014-12-30 DIAGNOSIS — X32XXXD Exposure to sunlight, subsequent encounter: Secondary | ICD-10-CM | POA: Diagnosis not present

## 2014-12-30 DIAGNOSIS — Z08 Encounter for follow-up examination after completed treatment for malignant neoplasm: Secondary | ICD-10-CM | POA: Diagnosis not present

## 2014-12-30 DIAGNOSIS — Z85828 Personal history of other malignant neoplasm of skin: Secondary | ICD-10-CM | POA: Diagnosis not present

## 2014-12-30 DIAGNOSIS — L570 Actinic keratosis: Secondary | ICD-10-CM | POA: Diagnosis not present

## 2015-01-23 ENCOUNTER — Ambulatory Visit (INDEPENDENT_AMBULATORY_CARE_PROVIDER_SITE_OTHER): Payer: Medicare Other

## 2015-01-23 DIAGNOSIS — Z23 Encounter for immunization: Secondary | ICD-10-CM | POA: Diagnosis not present

## 2015-01-24 ENCOUNTER — Other Ambulatory Visit: Payer: Self-pay | Admitting: Internal Medicine

## 2015-01-25 ENCOUNTER — Other Ambulatory Visit: Payer: Self-pay | Admitting: Internal Medicine

## 2015-02-07 DIAGNOSIS — Z85828 Personal history of other malignant neoplasm of skin: Secondary | ICD-10-CM | POA: Diagnosis not present

## 2015-02-07 DIAGNOSIS — L309 Dermatitis, unspecified: Secondary | ICD-10-CM | POA: Diagnosis not present

## 2015-02-07 DIAGNOSIS — L568 Other specified acute skin changes due to ultraviolet radiation: Secondary | ICD-10-CM | POA: Diagnosis not present

## 2015-02-07 DIAGNOSIS — L570 Actinic keratosis: Secondary | ICD-10-CM | POA: Diagnosis not present

## 2015-02-07 DIAGNOSIS — Z08 Encounter for follow-up examination after completed treatment for malignant neoplasm: Secondary | ICD-10-CM | POA: Diagnosis not present

## 2015-02-07 DIAGNOSIS — X32XXXD Exposure to sunlight, subsequent encounter: Secondary | ICD-10-CM | POA: Diagnosis not present

## 2015-03-29 ENCOUNTER — Encounter: Payer: Self-pay | Admitting: Physician Assistant

## 2015-03-29 ENCOUNTER — Ambulatory Visit (INDEPENDENT_AMBULATORY_CARE_PROVIDER_SITE_OTHER): Payer: Medicare Other | Admitting: Physician Assistant

## 2015-03-29 VITALS — BP 120/60 | HR 74 | Temp 97.5°F | Resp 16 | Ht 63.5 in | Wt 161.4 lb

## 2015-03-29 DIAGNOSIS — L299 Pruritus, unspecified: Secondary | ICD-10-CM | POA: Diagnosis not present

## 2015-03-29 DIAGNOSIS — R21 Rash and other nonspecific skin eruption: Secondary | ICD-10-CM | POA: Diagnosis not present

## 2015-03-29 LAB — POCT URINALYSIS DIP (MANUAL ENTRY)
Bilirubin, UA: NEGATIVE
Glucose, UA: NEGATIVE
Ketones, POC UA: NEGATIVE
Nitrite, UA: NEGATIVE
Protein Ur, POC: 100 — AB
Spec Grav, UA: 1.01
Urobilinogen, UA: 0.2
pH, UA: 6.5

## 2015-03-29 MED ORDER — CLOBETASOL PROP EMOLLIENT BASE 0.05 % EX CREA: TOPICAL_CREAM | CUTANEOUS | Status: DC

## 2015-03-29 MED ORDER — HYDROXYZINE HCL 10 MG PO TABS: 5.0000 mg | ORAL_TABLET | Freq: Three times a day (TID) | ORAL | Status: DC | PRN

## 2015-03-29 MED ORDER — LORATADINE 10 MG PO TABS: 10.0000 mg | ORAL_TABLET | Freq: Every day | ORAL | Status: DC

## 2015-03-29 NOTE — Progress Notes (Signed)
03/29/2015 2:07 PM   DOB: [birth year] / MRN: [id number]  SUBJECTIVE:  Rose Cruz is a 81 y.o. female presenting for generalized pruritis, mostly on her back, thighs and arms. Denies cough, GERD.  This has been present 3-4 weeks.  She has been putting hand cream on the itchy areas and says this helps for a few minutes.  She denies a history of eczema, asthma, ASA, and food allergies.  Denies medication changes in the last month. She is eating well.  She is not losing weight, denies fever and chills.     Immunization History  Administered Date(s) Administered  . Influenza Split 01/22/2012, 01/21/2013  . Influenza,inj,Quad PF,36+ Mos 02/10/2014, 01/23/2015  . Pneumococcal Conjugate-13 04/06/2014  . Tdap 07/23/2013  . Zoster 01/31/2012     She is allergic to penicillins and statins.   She  has a past medical history of Diabetes mellitus without complication ([place]); Hypertension; and Stroke [facility]).    She  reports that she has never smoked. She does not have any smokeless tobacco history on file. She reports that she does not drink alcohol or use illicit drugs. She  reports that she does not engage in sexual activity. The patient  has past surgical history that includes Tubal ligation; Spine surgery; Colon surgery; Eye surgery; and Fracture surgery.  Her family history includes Cancer in her brother, father, mother, and sister.  Review of Systems  Constitutional: Negative for fever and chills.  Eyes: Negative for blurred vision.  Respiratory: Negative for cough and shortness of breath.   Cardiovascular: Negative for chest pain.  Gastrointestinal: Negative for nausea and abdominal pain.  Genitourinary: Negative for dysuria, urgency and frequency.  Musculoskeletal: Negative for myalgias.  Skin: Positive for itching and rash.  Neurological: Negative for dizziness, tingling and headaches.  Psychiatric/Behavioral: Negative for depression. The patient is not nervous/anxious.      Problem list and medications reviewed and updated by myself where necessary, and exist elsewhere in the encounter.   OBJECTIVE:  BP 120/60 mmHg  Pulse 74  Temp(Src) 97.5 F (36.4 C) (Oral)  Resp 16  Ht 5' 3.5" (1.613 m)  Wt 161 lb 6.4 oz (73.211 kg)  BMI 28.14 kg/m2  SpO2 98%   Physical Exam  Constitutional: She is oriented to person, place, and time. She appears well-developed.  Eyes: EOM are normal. Pupils are equal, round, and reactive to light.  Cardiovascular: Normal rate.   Pulmonary/Chest: Effort normal.  Abdominal: She exhibits no distension.  Musculoskeletal: Normal range of motion.  Neurological: She is alert and oriented to person, place, and time. No cranial nerve deficit.  Skin: Skin is warm and dry. She is not diaphoretic.  Psychiatric: She has a normal mood and affect.  Vitals reviewed.    Results for orders placed or performed in visit on 03/29/15 (from the past 48 hour(s))  POCT urinalysis dipstick     Status: Abnormal   Collection Time: 03/29/15  2:03 PM  Result Value Ref Range   Color, UA yellow yellow   Clarity, UA clear clear   Glucose, UA negative negative   Bilirubin, UA negative negative   Ketones, POC UA negative negative   Spec Grav, UA 1.010    Blood, UA trace-lysed (A) negative   pH, UA 6.5    Protein Ur, POC =100 (A) negative   Urobilinogen, UA 0.2    Nitrite, UA Negative Negative   Leukocytes, UA small (1+) (A) Negative   Lab Results  Component Value  Date   CREATININE 1.51* 10/12/2014   CREATININE 1.27* 01/05/2014   CREATININE 1.24* 02/03/2013     ASSESSMENT AND PLAN  Rose Cruz was seen today for itching all over.  Diagnoses and all orders for this visit:  Pruritus: Will try her on steroid cream and claritin for now.  Hydrox as needed.  If labs are stable and medication does not help with biopsy or refer to derm.  Likely to biopsy.  -     CBC -     COMPLETE METABOLIC PANEL WITH GFR -     Hepatitis C antibody -     POCT  urinalysis dipstick -     Sedimentation Rate -     C-reactive protein   Other orders -     hydrOXYzine (ATARAX/VISTARIL) 10 MG tablet; Take 0.5-1 tablets (5-10 mg total) by mouth 3 (three) times daily as needed. -     Clobetasol Prop Emollient Base (CLOBETASOL PROPIONATE E) 0.05 % emollient cream; 1 small application to itchy spots twice daily -     loratadine (CLARITIN) 10 MG tablet; Take 1 tablet (10 mg total) by mouth daily.    The patient was advised to call or return to clinic if she does not see an improvement in symptoms or to seek the care of the closest emergency department if she worsens with the above plan.   Rose Cruz, MHS, PA-C [facility] 03/29/2015 2:07 PM

## 2015-03-30 LAB — COMPLETE METABOLIC PANEL WITH GFR
ALT: 27 U/L (ref 6–29)
AST: 34 U/L (ref 10–35)
Albumin: 4 g/dL (ref 3.6–5.1)
Alkaline Phosphatase: 79 U/L (ref 33–130)
BUN: 25 mg/dL (ref 7–25)
CO2: 21 mmol/L (ref 20–31)
Calcium: 9.5 mg/dL (ref 8.6–10.4)
Chloride: 101 mmol/L (ref 98–110)
Creat: 1.59 mg/dL — ABNORMAL HIGH (ref 0.60–0.88)
GFR, Est African American: 35 mL/min — ABNORMAL LOW (ref >=60)
GFR, Est Non African American: 30 mL/min — ABNORMAL LOW (ref >=60)
Glucose, Bld: 98 mg/dL (ref 65–99)
Potassium: 4 mmol/L (ref 3.5–5.3)
Sodium: 137 mmol/L (ref 135–146)
Total Bilirubin: 0.3 mg/dL (ref 0.2–1.2)
Total Protein: 6.4 g/dL (ref 6.1–8.1)

## 2015-03-30 LAB — CBC
HCT: 33.5 % — ABNORMAL LOW (ref 36.0–46.0)
Hemoglobin: 11 g/dL — ABNORMAL LOW (ref 12.0–15.0)
MCH: 28 pg (ref 26.0–34.0)
MCHC: 32.8 g/dL (ref 30.0–36.0)
MCV: 85.2 fL (ref 78.0–100.0)
MPV: 9.4 fL (ref 8.6–12.4)
Platelets: 307 10*3/uL (ref 150–400)
RBC: 3.93 MIL/uL (ref 3.87–5.11)
RDW: 14.4 % (ref 11.5–15.5)
WBC: 5.2 10*3/uL (ref 4.0–10.5)

## 2015-03-30 LAB — C-REACTIVE PROTEIN: CRP: <0.5 mg/dL (ref <0.60)

## 2015-03-30 LAB — HEPATITIS C ANTIBODY: HCV Ab: NEGATIVE

## 2015-03-30 LAB — SEDIMENTATION RATE: Sed Rate: 5 mm/hr (ref 0–30)

## 2015-04-11 DIAGNOSIS — L309 Dermatitis, unspecified: Secondary | ICD-10-CM | POA: Diagnosis not present

## 2015-04-13 ENCOUNTER — Telehealth: Payer: Self-pay

## 2015-04-13 NOTE — Telephone Encounter (Signed)
[name],  This Pt. Called today wanting to know lab results from last visit

## 2015-04-14 NOTE — Telephone Encounter (Signed)
Pt.notified

## 2015-04-21 DIAGNOSIS — L309 Dermatitis, unspecified: Secondary | ICD-10-CM | POA: Diagnosis not present

## 2015-04-21 DIAGNOSIS — L820 Inflamed seborrheic keratosis: Secondary | ICD-10-CM | POA: Diagnosis not present

## 2015-04-21 DIAGNOSIS — L281 Prurigo nodularis: Secondary | ICD-10-CM | POA: Diagnosis not present

## 2015-04-28 ENCOUNTER — Telehealth: Payer: Self-pay | Admitting: *Deleted

## 2015-04-28 NOTE — Telephone Encounter (Signed)
Pt notified of labs from December

## 2015-05-24 ENCOUNTER — Ambulatory Visit (INDEPENDENT_AMBULATORY_CARE_PROVIDER_SITE_OTHER): Payer: Medicare Other | Admitting: Internal Medicine

## 2015-05-24 ENCOUNTER — Encounter: Payer: Self-pay | Admitting: Internal Medicine

## 2015-05-24 VITALS — BP 155/55 | HR 101 | Temp 97.9°F | Resp 16 | Ht 63.5 in | Wt 160.0 lb

## 2015-05-24 DIAGNOSIS — E119 Type 2 diabetes mellitus without complications: Secondary | ICD-10-CM

## 2015-05-24 DIAGNOSIS — I10 Essential (primary) hypertension: Secondary | ICD-10-CM

## 2015-05-24 DIAGNOSIS — E785 Hyperlipidemia, unspecified: Secondary | ICD-10-CM

## 2015-05-24 LAB — POCT GLYCOSYLATED HEMOGLOBIN (HGB A1C): Hemoglobin A1C: 7.1

## 2015-05-24 MED ORDER — LOSARTAN POTASSIUM-HCTZ 100-25 MG PO TABS: 1.0000 | ORAL_TABLET | Freq: Every day | ORAL | Status: DC

## 2015-05-24 MED ORDER — VERAPAMIL HCL ER 180 MG PO TBCR: 180.0000 mg | EXTENDED_RELEASE_TABLET | Freq: Every day | ORAL | Status: DC

## 2015-05-24 MED ORDER — METFORMIN HCL 1000 MG PO TABS: ORAL_TABLET | ORAL | Status: DC

## 2015-05-24 NOTE — Progress Notes (Signed)
Subjective:    Patient ID: Rose Cruz, female    DOB: 23-Mar-1933, 82 y.o.   MRN: [id number]  HPIf/u Patient Active Problem List   Diagnosis Date Noted  . HTN (hypertension) 01/01/2012    Home bp 140s Asymptomatic 1/2 verap only at HS as she felt whole made her tired  . Diabetes mellitus ([place]) 01/01/2012    Checks rarely--asymptomatic  . Varicose veins 01/24/2012  . Hyperlipidemia 01/24/2012  . Anemia-chronic normocytic 01/24/2012  . Colonic polyp-colonoscopy 2010 dr Margie Ege 01/24/2012  . Chronic back pain 01/24/2012  . Microalbuminuria-2008 01/24/2012  . B12 deficiency-2009 01/24/2012  . Full dentures-age 56 01/24/2012  . TIA (transient ischemic attack)-01/13/10---can barely notice L sided weakness 01/24/2012  . BMI 28.0-28.9,adult--increasing activity--can now do 5 pushups 01/22/2012   Outpatient Encounter Prescriptions as of 05/24/2015  Medication Sig Note  . aspirin 81 MG tablet Take 81 mg by mouth daily.   . Ferrous Sulfate Dried 200 (65 FE) MG TABS Take by mouth.   . losartan-hydrochlorothiazide (HYZAAR) 100-25 MG tablet Take 1 tablet by mouth daily.   . metFORMIN (GLUCOPHAGE) 1000 MG tablet TAKE 1 TABLET BY MOUTH 2 TIMES DAILY WITH A MEAL   . Multiple Vitamin (MULTIVITAMIN) tablet Take 1 tablet by mouth daily.   . verapamil (CALAN-SR) 180 MG CR tablet Take 1 tablet (180 mg total) by mouth at bedtime.    No facility-administered encounter medications on file as of 05/24/2015.  Active with grandkids   Review of Systems Hall--medrol 2 rounds to control itching--see OVs   SARNA plus flucinonide ? This started as she was switched to a different generic metformin 3 m ago No active activity or appetite change No fever or fatigue No trouble swallowing No visual disturbance No shortness of breath No exertional chest pain No edema No abdominal pain No urinary complaints No headaches No falls or balance disturbance Can do all activities of daily living  Objective:   Physical Exam  Constitutional: She is oriented to person, place, and time. She appears well-developed and well-nourished. No distress.  HENT:  Head: Normocephalic and atraumatic.  Eyes: Pupils are equal, round, and reactive to light.  Neck: Normal range of motion.  Cardiovascular: Normal rate, regular rhythm, normal heart sounds and intact distal pulses.   No murmur heard. Pulmonary/Chest: Effort normal. No respiratory distress.  Musculoskeletal: Normal range of motion. She exhibits no edema.  Neurological: She is alert and oriented to person, place, and time.  Skin: Skin is warm and dry.  Rash has cleared-no dryness  Psychiatric: She has a normal mood and affect. Her behavior is normal.  Nursing note and vitals reviewed.  BP 155/55 mmHg  Pulse 101  Temp(Src) 97.9 F (36.6 C)  Resp 16  Ht 5' 3.5" (1.613 m)  Wt 160 lb (72.576 kg)  BMI 27.89 kg/m2 Diabetic Foot Exam - Simple   normal     A1C 7.1     Assessment & Plan:  Essential hypertension - Type 2 diabetes mellitus without complication, without long-term current use of insulin (HCC) Hyperlipidemia  Recent Allergic type rash S/p CVA 2011-stable  - Plan:  No changes for now except to continue to increase exercise AND to ask pharm to switch her metf to the prior generic to see if new one caused hypersens rxn Meds ordered this encounter  Medications  . verapamil (CALAN-SR) 180 MG CR tablet    Sig: Take 1 tablet (180 mg total) by mouth at bedtime.(she should take the whole pill)  Dispense:  90 tablet    Refill:  3  . losartan-hydrochlorothiazide (HYZAAR) 100-25 MG tablet    Sig: Take 1 tablet by mouth daily.    Dispense:  90 tablet    Refill:  3  . metFORMIN (GLUCOPHAGE) 1000 MG tablet    Sig: TAKE 1 TABLET BY MOUTH 2 TIMES DAILY WITH A MEAL    Dispense:  180 tablet    Refill:  3    She was changed to a different generic 3 or so months ago and has had a rash ever since-? Due to this change--can you change back     F/u 67m

## 2015-05-25 ENCOUNTER — Telehealth: Payer: Self-pay

## 2015-05-25 NOTE — Telephone Encounter (Signed)
Dr. Laney Pastor, patient needs clarification on her Metformin dosage. She thought the dosage was going to be decreased, but it seems to have increased. She used to take 500mg  and the prescription she picked up is for 1000mg . She just wants to make sure the dosage is correct. Cb# [phone]. Also she will be out from 11am-2pm so it's ok to leave a message if she doesn't answer.

## 2015-05-30 NOTE — Telephone Encounter (Signed)
I instructed patient to decrease to 500mg  (1/2 tablet) twice daily since her last a1c was 7.1 and Cr has been steadily increasing for the past 2 years, currently 1.59. Patient agreed. Dr. Laney Pastor, please let me know if this is not okay.

## 2015-05-30 NOTE — Telephone Encounter (Signed)
I reviewed chart. Dr. Laney Pastor said no changes in medication at time. Pt was give 500mg  tablets on 10/13/14. Then given 1,000mg  tablets on 01/26/15.  On 6/29 Dr. Laney Pastor did decrease patients dosage by 50% due to creatinine level; which is why she was prescribed the 500mg  tablets. Not sure which dosage pt should be taking.

## 2015-05-31 NOTE — Telephone Encounter (Signed)
I thought she was at 1000bid BUT if at 500 this is fine to do 1/2 tab bid espec with cr budging upward

## 2015-06-01 NOTE — Telephone Encounter (Signed)
Yes sir - she was concerned because she had been taking 500mg  BID. So she was happy to stay there. Thank you!

## 2015-07-12 ENCOUNTER — Ambulatory Visit (INDEPENDENT_AMBULATORY_CARE_PROVIDER_SITE_OTHER): Payer: Medicare Other | Admitting: Internal Medicine

## 2015-07-12 VITALS — BP 155/65 | HR 76 | Temp 98.5°F | Resp 16 | Ht 63.5 in | Wt 158.0 lb

## 2015-07-12 DIAGNOSIS — Z0000 Encounter for general adult medical examination without abnormal findings: Secondary | ICD-10-CM | POA: Diagnosis not present

## 2015-07-12 DIAGNOSIS — E118 Type 2 diabetes mellitus with unspecified complications: Secondary | ICD-10-CM | POA: Diagnosis not present

## 2015-07-12 DIAGNOSIS — E119 Type 2 diabetes mellitus without complications: Secondary | ICD-10-CM | POA: Diagnosis not present

## 2015-07-12 DIAGNOSIS — R809 Proteinuria, unspecified: Secondary | ICD-10-CM | POA: Diagnosis not present

## 2015-07-12 DIAGNOSIS — E1121 Type 2 diabetes mellitus with diabetic nephropathy: Secondary | ICD-10-CM

## 2015-07-12 DIAGNOSIS — E785 Hyperlipidemia, unspecified: Secondary | ICD-10-CM | POA: Diagnosis not present

## 2015-07-12 DIAGNOSIS — I10 Essential (primary) hypertension: Secondary | ICD-10-CM | POA: Diagnosis not present

## 2015-07-12 LAB — COMPREHENSIVE METABOLIC PANEL
ALT: 17 U/L (ref 6–29)
AST: 21 U/L (ref 10–35)
Albumin: 3.9 g/dL (ref 3.6–5.1)
Alkaline Phosphatase: 75 U/L (ref 33–130)
BUN: 25 mg/dL (ref 7–25)
CO2: 26 mmol/L (ref 20–31)
Calcium: 9.4 mg/dL (ref 8.6–10.4)
Chloride: 103 mmol/L (ref 98–110)
Creat: 1.39 mg/dL — ABNORMAL HIGH (ref 0.60–0.88)
Glucose, Bld: 108 mg/dL — ABNORMAL HIGH (ref 65–99)
Potassium: 4.7 mmol/L (ref 3.5–5.3)
Sodium: 139 mmol/L (ref 135–146)
Total Bilirubin: 0.4 mg/dL (ref 0.2–1.2)
Total Protein: 6.2 g/dL (ref 6.1–8.1)

## 2015-07-12 LAB — CBC WITH DIFFERENTIAL/PLATELET
Basophils Absolute: 0.1 10*3/uL (ref 0.0–0.1)
Basophils Relative: 1 % (ref 0–1)
Eosinophils Absolute: 0.8 10*3/uL — ABNORMAL HIGH (ref 0.0–0.7)
Eosinophils Relative: 12 % — ABNORMAL HIGH (ref 0–5)
HCT: 30.8 % — ABNORMAL LOW (ref 36.0–46.0)
Hemoglobin: 10.5 g/dL — ABNORMAL LOW (ref 12.0–15.0)
Lymphocytes Relative: 18 % (ref 12–46)
Lymphs Abs: 1.2 10*3/uL (ref 0.7–4.0)
MCH: 29.2 pg (ref 26.0–34.0)
MCHC: 34.1 g/dL (ref 30.0–36.0)
MCV: 85.8 fL (ref 78.0–100.0)
MPV: 9 fL (ref 8.6–12.4)
Monocytes Absolute: 0.5 10*3/uL (ref 0.1–1.0)
Monocytes Relative: 8 % (ref 3–12)
Neutro Abs: 4 10*3/uL (ref 1.7–7.7)
Neutrophils Relative %: 61 % (ref 43–77)
Platelets: 295 10*3/uL (ref 150–400)
RBC: 3.59 MIL/uL — ABNORMAL LOW (ref 3.87–5.11)
RDW: 14.2 % (ref 11.5–15.5)
WBC: 6.6 10*3/uL (ref 4.0–10.5)

## 2015-07-12 LAB — POCT URINALYSIS DIP (MANUAL ENTRY)
Bilirubin, UA: NEGATIVE
Glucose, UA: NEGATIVE
Ketones, POC UA: NEGATIVE
Nitrite, UA: POSITIVE — AB
Protein Ur, POC: 100 — AB
Spec Grav, UA: 1.02
Urobilinogen, UA: 0.2
pH, UA: 8.5

## 2015-07-12 LAB — LIPID PANEL
Cholesterol: 231 mg/dL — ABNORMAL HIGH (ref 125–200)
HDL: 62 mg/dL (ref >=46)
LDL Cholesterol: 153 mg/dL — ABNORMAL HIGH (ref <130)
Total CHOL/HDL Ratio: 3.7 Ratio (ref <=5.0)
Triglycerides: 79 mg/dL (ref <150)
VLDL: 16 mg/dL (ref <30)

## 2015-07-12 LAB — POCT GLYCOSYLATED HEMOGLOBIN (HGB A1C): Hemoglobin A1C: 6.6

## 2015-07-12 NOTE — Patient Instructions (Addendum)
[facility] at [place], [address], [place] [address] Phone: [phone]  Fax: [phone] Office hours: Monday - Friday, 8:00 a.m. - 5:00 p.m.   [facility] at [place], [id number] [place], [place] [address] Phone: [phone]  Fax: [phone] Office hours: Monday - 8:00 a.m. - 5:00 p.m. Tuesday- Friday 8:00 a.m. - 8:00 p.m. Saturday - 8:00 a.m. 5:00 p.m.  [facility] at [place], [address] [place], [place] [address] Phone: [phone]  Fax: [phone]  [facility] at [facility]  [address] [place], [place] [address] Phone: [phone]  Fax: [phone] Office hours: Monday - Friday, 8:00 a.m. - 5:00 p.m.   [place] at [facility] [address] [place], [place] [address] Phone: [phone]  Fax: [phone] Office Hours: Monday - Friday, 8:00 a.m. - 5:00 p.m.     [place] ,[place] Eric[place] R. Juleen China, [facility], [facility] [place] R. Juleen China, DO. [place],  [address] [phone]    [email or web address]    IF you received an x-ray today, you will receive an invoice from [facility]. Please contact [facility] at [phone] with questions or concerns regarding your invoice.   IF you received labwork today, you will receive an invoice from [employer]. Please contact [facility] at [phone] with questions or concerns regarding your invoice.   Our billing staff will not be able to assist you with questions regarding bills from these companies.  You will be contacted with the lab results as soon as they are available. The fastest way to get your results is to activate your My Chart  account. Instructions are located on the last page of this paperwork. If you have not heard from [place] regarding the results in 2 weeks, please contact this office.

## 2015-07-12 NOTE — Progress Notes (Signed)
Subjective:    Patient ID: Rose Cruz, female    DOB: 1932/05/17, 82 y.o.   MRN: [id number]  HPI Medicare annual Patient Active Problem List   Diagnosis Date Noted  . HTN (hypertension) 01/01/2012    Priority: Medium  . Diabetes mellitus with complication ([place]) 26/83/4196    Priority: Medium  . Diabetes ([place]) 07/12/2015  . Varicose veins 01/24/2012  . Hyperlipidemia 01/24/2012  . Anemia-chronic normocytic 01/24/2012  . Colonic polyp-colonoscopy 2004 01/24/2012  . Chronic back pain 01/24/2012  . Microalbuminuria-2008 01/24/2012  . B12 deficiency-2009 01/24/2012  . Full dentures-age 19 01/24/2012  . TIA (transient ischemic attack)-01/13/10 01/24/2012  . BMI 28.0-28.9,adult 01/22/2012   Outpatient Encounter Prescriptions as of 07/12/2015  Medication Sig  . aspirin 81 MG tablet Take 81 mg by mouth daily.  . Ferrous Sulfate Dried 200 (65 FE) MG TABS Take by mouth.  . losartan-hydrochlorothiazide (HYZAAR) 100-25 MG tablet Take 1 tablet by mouth daily.  . metFORMIN (GLUCOPHAGE) 1000 MG tablet TAKE 1 TABLET BY MOUTH 2 TIMES DAILY WITH A MEAL (Patient taking differently: 1,000 mg. TAKE 1 TABLET BY MOUTH 2 TIMES DAILY WITH A MEAL)  . Multiple Vitamin (MULTIVITAMIN) tablet Take 1 tablet by mouth daily.  . verapamil (CALAN-SR) 180 MG CR tablet Take 1 tablet (180 mg total) by mouth at bedtime.   No facility-administered encounter medications on file as of 07/12/2015.   A few months ago she decreased her metformin to half tablet twice a day  She has a few complaints today: Tired often/like a fatigue however she stays very active/he denies sleep issues Earache on the left one week after being out in the weekend itching over her back and anterior chest without skin rash over the last several months-has seen dermatologist Dr. Nevada Crane for this and was given a cream which has improved the allover rash down to just these 2 spots   Pneumovax completed with her last PCP Dr. Valere Dross Functional  status good No depression Health maintenance up-to-date Discussed end-of-life planning today in particular describing to her children what her wishes would be as she becomes more febrile and less able to take care of herself No memory issues in our conversation today  Review of Systems  Constitutional: Positive for fatigue. Negative for fever, activity change, appetite change and unexpected weight change.  HENT: Negative for hearing loss, postnasal drip and trouble swallowing.   Eyes: Negative for photophobia and visual disturbance.  Respiratory: Negative for cough, shortness of breath and wheezing.   Cardiovascular: Negative for chest pain, palpitations and leg swelling.  Gastrointestinal: Negative for abdominal pain, diarrhea, constipation and abdominal distention.  Genitourinary: Negative for difficulty urinating.  Musculoskeletal: Negative for joint swelling and gait problem.       History chronic back pain not very active at this point as she has done a great job of staying active and stretching  Skin:       She complains of skin friability with easy bruising on her arms for several years.  Neurological: Negative for dizziness, weakness and headaches.  Hematological: Bruises/bleeds easily.  Psychiatric/Behavioral: Negative for behavioral problems, sleep disturbance and decreased concentration.       Objective:   Physical Exam  Constitutional: She is oriented to person, place, and time. She appears well-developed and well-nourished. No distress.  HENT:  Head: Normocephalic.  Right Ear: External ear normal.  Left Ear: External ear normal.  Nose: Nose normal.  Mouth/Throat: Oropharynx is clear and moist.  Eyes: Conjunctivae and EOM  are normal. Pupils are equal, round, and reactive to light.  Wearing glasses  Neck: Normal range of motion. Neck supple. No thyromegaly present.  Cardiovascular: Normal rate, regular rhythm, normal heart sounds and intact distal pulses.   No murmur  heard. Pulmonary/Chest: Effort normal and breath sounds normal. She has no wheezes.  Abdominal: Soft. Bowel sounds are normal. She exhibits no distension and no mass. There is no tenderness. There is no rebound.  Musculoskeletal: Normal range of motion. She exhibits no edema or tenderness.  Lymphadenopathy:    She has no cervical adenopathy.  Neurological: She is alert and oriented to person, place, and time. She has normal reflexes. No cranial nerve deficit.  Skin: Skin is warm and dry.  She has thin skin on her arms with ecchymoses. She has areas of excoriation on her low back and upper chest dryness to skin slight thickness but no demonstrable skin lesions  Psychiatric: She has a normal mood and affect. Her behavior is normal. Judgment and thought content normal.  Nursing note and vitals reviewed. BP 155/65 mmHg  Pulse 76  Temp(Src) 98.5 F (36.9 C)  Resp 16  Ht 5' 3.5" (1.613 m)  Wt 158 lb (71.668 kg)  BMI 27.55 kg/m2  Results for orders placed or performed in visit on 07/12/15  Comprehensive metabolic panel  Result Value Ref Range   Sodium 139 135 - 146 mmol/L   Potassium 4.7 3.5 - 5.3 mmol/L   Chloride 103 98 - 110 mmol/L   CO2 26 20 - 31 mmol/L   Glucose, Bld 108 (H) 65 - 99 mg/dL   BUN 25 7 - 25 mg/dL   Creat 1.39 (H) 0.60 - 0.88 mg/dL   Total Bilirubin 0.4 0.2 - 1.2 mg/dL   Alkaline Phosphatase 75 33 - 130 U/L   AST 21 10 - 35 U/L   ALT 17 6 - 29 U/L   Total Protein 6.2 6.1 - 8.1 g/dL   Albumin 3.9 3.6 - 5.1 g/dL   Calcium 9.4 8.6 - 10.4 mg/dL  CBC with Differential/Platelet  Result Value Ref Range   WBC 6.6 4.0 - 10.5 K/uL   RBC 3.59 (L) 3.87 - 5.11 MIL/uL   Hemoglobin 10.5 (L) 12.0 - 15.0 g/dL   HCT 30.8 (L) 36.0 - 46.0 %   MCV 85.8 78.0 - 100.0 fL   MCH 29.2 26.0 - 34.0 pg   MCHC 34.1 30.0 - 36.0 g/dL   RDW 14.2 11.5 - 15.5 %   Platelets 295 150 - 400 K/uL   MPV 9.0 8.6 - 12.4 fL   Neutrophils Relative % 61 43 - 77 %   Neutro Abs 4.0 1.7 - 7.7 K/uL    Lymphocytes Relative 18 12 - 46 %   Lymphs Abs 1.2 0.7 - 4.0 K/uL   Monocytes Relative 8 3 - 12 %   Monocytes Absolute 0.5 0.1 - 1.0 K/uL   Eosinophils Relative 12 (H) 0 - 5 %   Eosinophils Absolute 0.8 (H) 0.0 - 0.7 K/uL   Basophils Relative 1 0 - 1 %   Basophils Absolute 0.1 0.0 - 0.1 K/uL   Smear Review Criteria for review not met   Lipid panel  Result Value Ref Range   Cholesterol 231 (H) 125 - 200 mg/dL   Triglycerides 79 <150 mg/dL   HDL 62 >=46 mg/dL   Total CHOL/HDL Ratio 3.7 <=5.0 Ratio   VLDL 16 <30 mg/dL   LDL Cholesterol 153 (H) <130 mg/dL  POCT glycosylated  hemoglobin (Hb A1C)  Result Value Ref Range   Hemoglobin A1C 6.6   POCT urinalysis dipstick  Result Value Ref Range   Color, UA yellow yellow   Clarity, UA cloudy (A) clear   Glucose, UA negative negative   Bilirubin, UA negative negative   Ketones, POC UA negative negative   Spec Grav, UA 1.020    Blood, UA trace-lysed (A) negative   pH, UA 8.5    Protein Ur, POC =100 (A) negative   Urobilinogen, UA 0.2    Nitrite, UA Positive (A) Negative   Leukocytes, UA small (1+) (A) Negative          Assessment & Plan:  Annual Medicare wellness visit subsequent encounter   Essential hypertension - Systolic hypertension elderly  She will monitor home blood pressures with a goal of systolic 157-262  Continue Hyzaar 100/25 and verapamil 180 SR  Hyperlipidemia - off medications by her choice due to statin side effects with LDL 153 Will discuss omega-3 fatty acid  Type 2 diabetes mellitus with diabetic nephropathy, without long-term current use of insulin (HCC)-microalbuminuria first discovered 2008//creatinine stable over the last 3 years mildly elevated -Her hemoglobin A1c has decreased from 7.1 to 6.6 in 2 months as she decreased metformin to half tablet twice a day (500 mg twice a day)--she has been below 7 on every other test over the last 3 years while she was taking thousand twice a day--hold discuss changing  to 500 mg extended release once a day with her  Phylliss Blakes will start a daily conditioning program to see her response -Her urine is abnormal with positive nitrites--last occurred 2 offer a clean-catch for culture////? Subclinical infection causing fatigue  Chronic normocytic anemia stable--she is on a multivitamin with iron  Status post TIA 2011 stable   She would like to follow-up with the practice in [place] or [facility] when I retire and I have given her suggestions for both internal medicine and family practice

## 2015-08-06 ENCOUNTER — Encounter: Payer: Self-pay | Admitting: *Deleted

## 2015-08-11 ENCOUNTER — Other Ambulatory Visit: Payer: Self-pay | Admitting: *Deleted

## 2015-08-11 ENCOUNTER — Other Ambulatory Visit (INDEPENDENT_AMBULATORY_CARE_PROVIDER_SITE_OTHER): Payer: Medicare Other

## 2015-08-11 DIAGNOSIS — N390 Urinary tract infection, site not specified: Secondary | ICD-10-CM

## 2015-08-11 LAB — POC MICROSCOPIC URINALYSIS (UMFC): Mucus: ABSENT

## 2015-08-11 LAB — POCT URINALYSIS DIP (MANUAL ENTRY)
Bilirubin, UA: NEGATIVE
Glucose, UA: NEGATIVE
Ketones, POC UA: NEGATIVE
Nitrite, UA: POSITIVE — AB
Protein Ur, POC: >=300 — AB
Spec Grav, UA: 1.015
Urobilinogen, UA: 0.2
pH, UA: 7

## 2015-08-11 NOTE — Telephone Encounter (Signed)
Which ext release metformin is the cheap on on medicare???

## 2015-08-11 NOTE — Telephone Encounter (Signed)
I pended the cheap one. I saw you have given pt a yr of RFs on her metformin before, so I pended it that way. Change if you want to give less.

## 2015-08-11 NOTE — Telephone Encounter (Signed)
Pt would like a rx for the once a day tablet sent to her pharmacy.  Please call when this is sent in.   Notes Recorded by Leandrew Koyanagi, MD on 07/13/2015 at 12:09 PM Call her. Hemoglobin A1c 6.6 on her current dose of 500 mg twice a day (half a tablet twice a day) is some good that she could change to a single tablet of 500 mg extended release taken once a day which I would have to call in for her if she agrees. All the other labs great, except her urinalysis which indicated that she might have an underlying infection. To be sure like to do a clean-catch for urine culture at her convenience over the next several days if you would please notify her and set up a future order for me to sign

## 2015-08-13 MED ORDER — METFORMIN HCL ER 500 MG PO TB24: 500.0000 mg | ORAL_TABLET | Freq: Every day | ORAL | Status: DC

## 2015-08-15 LAB — URINE CULTURE: Colony Count: >=100000

## 2015-08-17 NOTE — Progress Notes (Signed)
   Subjective:    Patient ID: YOSHIE THIBAUT, female    DOB: 12/15/1932, 82 y.o.   MRN: [id number]  HPI See hx urinary sxt   Review of Systems     Objective:   Physical Exam        Assessment & Plan:  Lab only  Results for orders placed or performed in visit on 08/11/15  Urine culture  Result Value Ref Range   Culture KLEBSIELLA PNEUMONIAE    Colony Count >=100,000 COLONIES/ML    Organism ID, Bacteria KLEBSIELLA PNEUMONIAE       Susceptibility   Klebsiella pneumoniae -  (no method available)    AMPICILLIN >=32 Resistant     AMOX/CLAVULANIC 4 Sensitive     AMPICILLIN/SULBACTAM 8 Sensitive     PIP/TAZO <=4 Sensitive     IMIPENEM <=0.25 Sensitive     CEFAZOLIN <=4 Sensitive     CEFTRIAXONE <=1 Sensitive     CEFTAZIDIME <=1 Sensitive     CEFEPIME <=1 Sensitive     GENTAMICIN <=1 Sensitive     TOBRAMYCIN <=1 Sensitive     CIPROFLOXACIN <=0.25 Sensitive     LEVOFLOXACIN <=0.12 Sensitive     NITROFURANTOIN <=16 Sensitive     TRIMETH/SULFA* <=20 Sensitive      * NR=NOT REPORTABLE,SEE COMMENTORAL therapy:A cefazolin MIC of <32 predicts susceptibility to the oral agents cefaclor,cefdinir,cefpodoxime,cefprozil,cefuroxime,cephalexin,and loracarbef when used for therapy of uncomplicated UTIs due to E.coli,K.pneumomiae,and P.mirabilis. PARENTERAL therapy: A cefazolinMIC of >8 indicates resistance to parenteralcefazolin. An alternate test method must beperformed to confirm susceptibility to parenteralcefazolin.  POCT urinalysis dipstick  Result Value Ref Range   Color, UA yellow yellow   Clarity, UA cloudy (A) clear   Glucose, UA negative negative   Bilirubin, UA negative negative   Ketones, POC UA negative negative   Spec Grav, UA 1.015    Blood, UA trace-lysed (A) negative   pH, UA 7.0    Protein Ur, POC >=300 (A) negative   Urobilinogen, UA 0.2    Nitrite, UA Positive (A) Negative   Leukocytes, UA large (3+) (A) Negative  POCT Microscopic Urinalysis ([facility])  Result  Value Ref Range   WBC,UR,HPF,POC Moderate (A) None WBC/hpf   RBC,UR,HPF,POC None None RBC/hpf   Bacteria Many (A) None, Too numerous to count   Mucus Absent Absent   Epithelial Cells, UR Per Microscopy Few (A) None, Too numerous to count cells/hpf   Since symptomatic we will treat

## 2015-08-17 NOTE — Addendum Note (Signed)
Addended by: Leandrew Koyanagi on: 08/17/2015 12:51 PM   Modules accepted: Level of Service

## 2015-08-19 ENCOUNTER — Other Ambulatory Visit: Payer: Self-pay

## 2015-08-19 MED ORDER — NITROFURANTOIN MONOHYD MACRO 100 MG PO CAPS: 100.0000 mg | ORAL_CAPSULE | Freq: Two times a day (BID) | ORAL | Status: DC

## 2015-08-28 ENCOUNTER — Other Ambulatory Visit: Payer: Self-pay | Admitting: Internal Medicine

## 2015-09-20 ENCOUNTER — Telehealth: Payer: Self-pay

## 2015-09-20 NOTE — Telephone Encounter (Signed)
Patient wanted to get Dr. Laney Pastor opinion about another Doctor since he is retiring.  If he could call patient back at [phone]  Thank you,

## 2015-09-20 NOTE — Telephone Encounter (Signed)
Dr Laney Pastor, please advise of your recommendation.

## 2015-09-20 NOTE — Telephone Encounter (Signed)
Patient called back stating that her phone isnt allowing incoming calls so she would like someone to call her on her cell phone that number is [phone].

## 2015-09-26 NOTE — Telephone Encounter (Signed)
i would suggest Dr Bernerd Limbo [phone] Can we help make this work

## 2015-09-27 NOTE — Telephone Encounter (Signed)
Tried to call Dr Chales Abrahams office to find out if he is accepting new pts and if they need a referral. Their Office system was not working and I could not get a person on the phone. I called and left detailed info to pt and asked her to call me back if they need a referral.

## 2015-10-10 DIAGNOSIS — I10 Essential (primary) hypertension: Secondary | ICD-10-CM | POA: Diagnosis not present

## 2015-10-10 DIAGNOSIS — E118 Type 2 diabetes mellitus with unspecified complications: Secondary | ICD-10-CM | POA: Diagnosis not present

## 2015-10-10 DIAGNOSIS — Z8673 Personal history of transient ischemic attack (TIA), and cerebral infarction without residual deficits: Secondary | ICD-10-CM | POA: Diagnosis not present

## 2016-01-12 DIAGNOSIS — I10 Essential (primary) hypertension: Secondary | ICD-10-CM | POA: Diagnosis not present

## 2016-01-12 DIAGNOSIS — E785 Hyperlipidemia, unspecified: Secondary | ICD-10-CM | POA: Diagnosis not present

## 2016-01-12 DIAGNOSIS — E118 Type 2 diabetes mellitus with unspecified complications: Secondary | ICD-10-CM | POA: Diagnosis not present

## 2016-01-12 DIAGNOSIS — N183 Chronic kidney disease, stage 3 (moderate): Secondary | ICD-10-CM | POA: Diagnosis not present

## 2016-01-12 DIAGNOSIS — Z23 Encounter for immunization: Secondary | ICD-10-CM | POA: Diagnosis not present

## 2016-01-12 DIAGNOSIS — Z13820 Encounter for screening for osteoporosis: Secondary | ICD-10-CM | POA: Diagnosis not present

## 2016-01-12 DIAGNOSIS — Z9189 Other specified personal risk factors, not elsewhere classified: Secondary | ICD-10-CM | POA: Diagnosis not present

## 2016-02-02 DIAGNOSIS — Z13820 Encounter for screening for osteoporosis: Secondary | ICD-10-CM | POA: Diagnosis not present

## 2016-02-02 DIAGNOSIS — M810 Age-related osteoporosis without current pathological fracture: Secondary | ICD-10-CM | POA: Diagnosis not present

## 2016-02-02 DIAGNOSIS — Z780 Asymptomatic menopausal state: Secondary | ICD-10-CM | POA: Diagnosis not present

## 2016-02-02 DIAGNOSIS — M8588 Other specified disorders of bone density and structure, other site: Secondary | ICD-10-CM | POA: Diagnosis not present

## 2016-03-21 DIAGNOSIS — J320 Chronic maxillary sinusitis: Secondary | ICD-10-CM | POA: Diagnosis not present

## 2016-04-12 DIAGNOSIS — D225 Melanocytic nevi of trunk: Secondary | ICD-10-CM | POA: Diagnosis not present

## 2016-04-12 DIAGNOSIS — C44629 Squamous cell carcinoma of skin of left upper limb, including shoulder: Secondary | ICD-10-CM | POA: Diagnosis not present

## 2016-04-12 DIAGNOSIS — X32XXXD Exposure to sunlight, subsequent encounter: Secondary | ICD-10-CM | POA: Diagnosis not present

## 2016-04-12 DIAGNOSIS — L570 Actinic keratosis: Secondary | ICD-10-CM | POA: Diagnosis not present

## 2016-04-29 DIAGNOSIS — E785 Hyperlipidemia, unspecified: Secondary | ICD-10-CM | POA: Diagnosis not present

## 2016-04-29 DIAGNOSIS — I10 Essential (primary) hypertension: Secondary | ICD-10-CM | POA: Diagnosis not present

## 2016-04-29 DIAGNOSIS — N183 Chronic kidney disease, stage 3 (moderate): Secondary | ICD-10-CM | POA: Diagnosis not present

## 2016-04-29 DIAGNOSIS — E118 Type 2 diabetes mellitus with unspecified complications: Secondary | ICD-10-CM | POA: Diagnosis not present

## 2016-05-07 DIAGNOSIS — X32XXXD Exposure to sunlight, subsequent encounter: Secondary | ICD-10-CM | POA: Diagnosis not present

## 2016-05-07 DIAGNOSIS — Z08 Encounter for follow-up examination after completed treatment for malignant neoplasm: Secondary | ICD-10-CM | POA: Diagnosis not present

## 2016-05-07 DIAGNOSIS — L570 Actinic keratosis: Secondary | ICD-10-CM | POA: Diagnosis not present

## 2016-05-07 DIAGNOSIS — Z85828 Personal history of other malignant neoplasm of skin: Secondary | ICD-10-CM | POA: Diagnosis not present

## 2016-05-21 DIAGNOSIS — E118 Type 2 diabetes mellitus with unspecified complications: Secondary | ICD-10-CM | POA: Diagnosis not present

## 2016-05-21 DIAGNOSIS — I10 Essential (primary) hypertension: Secondary | ICD-10-CM | POA: Diagnosis not present

## 2016-05-31 ENCOUNTER — Other Ambulatory Visit: Payer: Self-pay

## 2016-05-31 MED ORDER — LOSARTAN POTASSIUM-HCTZ 100-25 MG PO TABS: 1.0000 | ORAL_TABLET | Freq: Every day | ORAL | 1 refills | Status: AC

## 2016-05-31 NOTE — Telephone Encounter (Signed)
Fax req from [facility] for Losartan-HCTZ Refill 30 days with note to establish with PCP - was [facility].

## 2016-06-04 DIAGNOSIS — Z85828 Personal history of other malignant neoplasm of skin: Secondary | ICD-10-CM | POA: Diagnosis not present

## 2016-06-04 DIAGNOSIS — Z08 Encounter for follow-up examination after completed treatment for malignant neoplasm: Secondary | ICD-10-CM | POA: Diagnosis not present

## 2016-06-04 DIAGNOSIS — L821 Other seborrheic keratosis: Secondary | ICD-10-CM | POA: Diagnosis not present

## 2016-06-04 DIAGNOSIS — L928 Other granulomatous disorders of the skin and subcutaneous tissue: Secondary | ICD-10-CM | POA: Diagnosis not present

## 2016-06-14 DIAGNOSIS — I10 Essential (primary) hypertension: Secondary | ICD-10-CM | POA: Diagnosis not present

## 2016-06-14 DIAGNOSIS — H6983 Other specified disorders of Eustachian tube, bilateral: Secondary | ICD-10-CM | POA: Diagnosis not present

## 2016-09-10 DIAGNOSIS — R2689 Other abnormalities of gait and mobility: Secondary | ICD-10-CM | POA: Diagnosis not present

## 2016-09-10 DIAGNOSIS — N183 Chronic kidney disease, stage 3 (moderate): Secondary | ICD-10-CM | POA: Diagnosis not present

## 2016-09-10 DIAGNOSIS — E118 Type 2 diabetes mellitus with unspecified complications: Secondary | ICD-10-CM | POA: Diagnosis not present

## 2016-09-10 DIAGNOSIS — E785 Hyperlipidemia, unspecified: Secondary | ICD-10-CM | POA: Diagnosis not present

## 2016-09-10 DIAGNOSIS — I10 Essential (primary) hypertension: Secondary | ICD-10-CM | POA: Diagnosis not present

## 2016-09-17 DIAGNOSIS — N183 Chronic kidney disease, stage 3 (moderate): Secondary | ICD-10-CM | POA: Diagnosis not present

## 2016-09-20 DIAGNOSIS — R293 Abnormal posture: Secondary | ICD-10-CM | POA: Diagnosis not present

## 2016-09-20 DIAGNOSIS — R531 Weakness: Secondary | ICD-10-CM | POA: Diagnosis not present

## 2016-09-20 DIAGNOSIS — R279 Unspecified lack of coordination: Secondary | ICD-10-CM | POA: Diagnosis not present

## 2016-09-20 DIAGNOSIS — R2689 Other abnormalities of gait and mobility: Secondary | ICD-10-CM | POA: Diagnosis not present

## 2016-09-23 DIAGNOSIS — G8929 Other chronic pain: Secondary | ICD-10-CM | POA: Diagnosis not present

## 2016-09-23 DIAGNOSIS — Z7982 Long term (current) use of aspirin: Secondary | ICD-10-CM | POA: Diagnosis not present

## 2016-09-23 DIAGNOSIS — R001 Bradycardia, unspecified: Secondary | ICD-10-CM | POA: Diagnosis not present

## 2016-09-23 DIAGNOSIS — I129 Hypertensive chronic kidney disease with stage 1 through stage 4 chronic kidney disease, or unspecified chronic kidney disease: Secondary | ICD-10-CM | POA: Diagnosis not present

## 2016-09-23 DIAGNOSIS — D509 Iron deficiency anemia, unspecified: Secondary | ICD-10-CM | POA: Diagnosis not present

## 2016-09-23 DIAGNOSIS — I6789 Other cerebrovascular disease: Secondary | ICD-10-CM | POA: Diagnosis not present

## 2016-09-23 DIAGNOSIS — I952 Hypotension due to drugs: Secondary | ICD-10-CM | POA: Diagnosis not present

## 2016-09-23 DIAGNOSIS — N289 Disorder of kidney and ureter, unspecified: Secondary | ICD-10-CM | POA: Diagnosis not present

## 2016-09-23 DIAGNOSIS — I10 Essential (primary) hypertension: Secondary | ICD-10-CM | POA: Diagnosis not present

## 2016-09-23 DIAGNOSIS — M549 Dorsalgia, unspecified: Secondary | ICD-10-CM | POA: Diagnosis not present

## 2016-09-23 DIAGNOSIS — N184 Chronic kidney disease, stage 4 (severe): Secondary | ICD-10-CM | POA: Diagnosis not present

## 2016-09-23 DIAGNOSIS — E119 Type 2 diabetes mellitus without complications: Secondary | ICD-10-CM | POA: Diagnosis not present

## 2016-09-23 DIAGNOSIS — I499 Cardiac arrhythmia, unspecified: Secondary | ICD-10-CM | POA: Diagnosis not present

## 2016-09-23 DIAGNOSIS — Z8673 Personal history of transient ischemic attack (TIA), and cerebral infarction without residual deficits: Secondary | ICD-10-CM | POA: Diagnosis not present

## 2016-09-23 DIAGNOSIS — R51 Headache: Secondary | ICD-10-CM | POA: Diagnosis not present

## 2016-09-23 DIAGNOSIS — E118 Type 2 diabetes mellitus with unspecified complications: Secondary | ICD-10-CM | POA: Diagnosis not present

## 2016-09-23 DIAGNOSIS — Z79899 Other long term (current) drug therapy: Secondary | ICD-10-CM | POA: Diagnosis not present

## 2016-09-23 DIAGNOSIS — I959 Hypotension, unspecified: Secondary | ICD-10-CM | POA: Diagnosis not present

## 2016-09-27 DIAGNOSIS — R531 Weakness: Secondary | ICD-10-CM | POA: Diagnosis not present

## 2016-09-27 DIAGNOSIS — R293 Abnormal posture: Secondary | ICD-10-CM | POA: Diagnosis not present

## 2016-09-27 DIAGNOSIS — R2689 Other abnormalities of gait and mobility: Secondary | ICD-10-CM | POA: Diagnosis not present

## 2016-09-27 DIAGNOSIS — R279 Unspecified lack of coordination: Secondary | ICD-10-CM | POA: Diagnosis not present

## 2016-09-30 DIAGNOSIS — Z09 Encounter for follow-up examination after completed treatment for conditions other than malignant neoplasm: Secondary | ICD-10-CM | POA: Diagnosis not present

## 2016-09-30 DIAGNOSIS — E118 Type 2 diabetes mellitus with unspecified complications: Secondary | ICD-10-CM | POA: Diagnosis not present

## 2016-09-30 DIAGNOSIS — I10 Essential (primary) hypertension: Secondary | ICD-10-CM | POA: Diagnosis not present

## 2016-09-30 DIAGNOSIS — N183 Chronic kidney disease, stage 3 (moderate): Secondary | ICD-10-CM | POA: Diagnosis not present

## 2016-09-30 DIAGNOSIS — D509 Iron deficiency anemia, unspecified: Secondary | ICD-10-CM | POA: Diagnosis not present

## 2016-09-30 DIAGNOSIS — N184 Chronic kidney disease, stage 4 (severe): Secondary | ICD-10-CM | POA: Diagnosis not present

## 2016-10-03 DIAGNOSIS — E119 Type 2 diabetes mellitus without complications: Secondary | ICD-10-CM | POA: Diagnosis not present

## 2016-10-04 DIAGNOSIS — R293 Abnormal posture: Secondary | ICD-10-CM | POA: Diagnosis not present

## 2016-10-04 DIAGNOSIS — R531 Weakness: Secondary | ICD-10-CM | POA: Diagnosis not present

## 2016-10-04 DIAGNOSIS — R279 Unspecified lack of coordination: Secondary | ICD-10-CM | POA: Diagnosis not present

## 2016-10-04 DIAGNOSIS — R2689 Other abnormalities of gait and mobility: Secondary | ICD-10-CM | POA: Diagnosis not present

## 2016-10-07 DIAGNOSIS — R2689 Other abnormalities of gait and mobility: Secondary | ICD-10-CM | POA: Diagnosis not present

## 2016-10-07 DIAGNOSIS — R279 Unspecified lack of coordination: Secondary | ICD-10-CM | POA: Diagnosis not present

## 2016-10-07 DIAGNOSIS — R531 Weakness: Secondary | ICD-10-CM | POA: Diagnosis not present

## 2016-10-07 DIAGNOSIS — R293 Abnormal posture: Secondary | ICD-10-CM | POA: Diagnosis not present

## 2016-10-10 DIAGNOSIS — R531 Weakness: Secondary | ICD-10-CM | POA: Diagnosis not present

## 2016-10-10 DIAGNOSIS — R293 Abnormal posture: Secondary | ICD-10-CM | POA: Diagnosis not present

## 2016-10-10 DIAGNOSIS — R279 Unspecified lack of coordination: Secondary | ICD-10-CM | POA: Diagnosis not present

## 2016-10-10 DIAGNOSIS — R2689 Other abnormalities of gait and mobility: Secondary | ICD-10-CM | POA: Diagnosis not present

## 2016-10-14 DIAGNOSIS — R279 Unspecified lack of coordination: Secondary | ICD-10-CM | POA: Diagnosis not present

## 2016-10-14 DIAGNOSIS — R2689 Other abnormalities of gait and mobility: Secondary | ICD-10-CM | POA: Diagnosis not present

## 2016-10-14 DIAGNOSIS — R293 Abnormal posture: Secondary | ICD-10-CM | POA: Diagnosis not present

## 2016-10-14 DIAGNOSIS — R531 Weakness: Secondary | ICD-10-CM | POA: Diagnosis not present

## 2016-10-17 DIAGNOSIS — R531 Weakness: Secondary | ICD-10-CM | POA: Diagnosis not present

## 2016-10-17 DIAGNOSIS — R279 Unspecified lack of coordination: Secondary | ICD-10-CM | POA: Diagnosis not present

## 2016-10-17 DIAGNOSIS — R293 Abnormal posture: Secondary | ICD-10-CM | POA: Diagnosis not present

## 2016-10-17 DIAGNOSIS — R2689 Other abnormalities of gait and mobility: Secondary | ICD-10-CM | POA: Diagnosis not present

## 2016-10-21 DIAGNOSIS — R531 Weakness: Secondary | ICD-10-CM | POA: Diagnosis not present

## 2016-10-21 DIAGNOSIS — R2681 Unsteadiness on feet: Secondary | ICD-10-CM | POA: Diagnosis not present

## 2016-10-21 DIAGNOSIS — R279 Unspecified lack of coordination: Secondary | ICD-10-CM | POA: Diagnosis not present

## 2016-10-21 DIAGNOSIS — Z9181 History of falling: Secondary | ICD-10-CM | POA: Diagnosis not present

## 2016-10-24 DIAGNOSIS — E118 Type 2 diabetes mellitus with unspecified complications: Secondary | ICD-10-CM | POA: Diagnosis not present

## 2016-10-24 DIAGNOSIS — N183 Chronic kidney disease, stage 3 (moderate): Secondary | ICD-10-CM | POA: Diagnosis not present

## 2016-10-24 DIAGNOSIS — E785 Hyperlipidemia, unspecified: Secondary | ICD-10-CM | POA: Diagnosis not present

## 2016-10-24 DIAGNOSIS — I10 Essential (primary) hypertension: Secondary | ICD-10-CM | POA: Diagnosis not present

## 2016-10-25 DIAGNOSIS — R279 Unspecified lack of coordination: Secondary | ICD-10-CM | POA: Diagnosis not present

## 2016-10-25 DIAGNOSIS — R2681 Unsteadiness on feet: Secondary | ICD-10-CM | POA: Diagnosis not present

## 2016-10-25 DIAGNOSIS — Z9181 History of falling: Secondary | ICD-10-CM | POA: Diagnosis not present

## 2016-10-25 DIAGNOSIS — R531 Weakness: Secondary | ICD-10-CM | POA: Diagnosis not present

## 2016-10-28 DIAGNOSIS — Z9181 History of falling: Secondary | ICD-10-CM | POA: Diagnosis not present

## 2016-10-28 DIAGNOSIS — R531 Weakness: Secondary | ICD-10-CM | POA: Diagnosis not present

## 2016-10-28 DIAGNOSIS — R2681 Unsteadiness on feet: Secondary | ICD-10-CM | POA: Diagnosis not present

## 2016-10-28 DIAGNOSIS — R279 Unspecified lack of coordination: Secondary | ICD-10-CM | POA: Diagnosis not present

## 2016-10-31 DIAGNOSIS — R2681 Unsteadiness on feet: Secondary | ICD-10-CM | POA: Diagnosis not present

## 2016-10-31 DIAGNOSIS — R531 Weakness: Secondary | ICD-10-CM | POA: Diagnosis not present

## 2016-10-31 DIAGNOSIS — R279 Unspecified lack of coordination: Secondary | ICD-10-CM | POA: Diagnosis not present

## 2016-10-31 DIAGNOSIS — Z9181 History of falling: Secondary | ICD-10-CM | POA: Diagnosis not present

## 2016-11-04 DIAGNOSIS — Z9181 History of falling: Secondary | ICD-10-CM | POA: Diagnosis not present

## 2016-11-04 DIAGNOSIS — R279 Unspecified lack of coordination: Secondary | ICD-10-CM | POA: Diagnosis not present

## 2016-11-04 DIAGNOSIS — R531 Weakness: Secondary | ICD-10-CM | POA: Diagnosis not present

## 2016-11-04 DIAGNOSIS — R2681 Unsteadiness on feet: Secondary | ICD-10-CM | POA: Diagnosis not present

## 2016-11-11 DIAGNOSIS — R2681 Unsteadiness on feet: Secondary | ICD-10-CM | POA: Diagnosis not present

## 2016-11-11 DIAGNOSIS — Z9181 History of falling: Secondary | ICD-10-CM | POA: Diagnosis not present

## 2016-11-11 DIAGNOSIS — R531 Weakness: Secondary | ICD-10-CM | POA: Diagnosis not present

## 2016-11-11 DIAGNOSIS — R279 Unspecified lack of coordination: Secondary | ICD-10-CM | POA: Diagnosis not present

## 2016-11-14 DIAGNOSIS — R3129 Other microscopic hematuria: Secondary | ICD-10-CM | POA: Diagnosis not present

## 2016-11-14 DIAGNOSIS — N184 Chronic kidney disease, stage 4 (severe): Secondary | ICD-10-CM | POA: Diagnosis not present

## 2016-11-14 DIAGNOSIS — E871 Hypo-osmolality and hyponatremia: Secondary | ICD-10-CM | POA: Diagnosis not present

## 2016-11-14 DIAGNOSIS — R001 Bradycardia, unspecified: Secondary | ICD-10-CM | POA: Diagnosis not present

## 2016-11-14 DIAGNOSIS — I129 Hypertensive chronic kidney disease with stage 1 through stage 4 chronic kidney disease, or unspecified chronic kidney disease: Secondary | ICD-10-CM | POA: Diagnosis not present

## 2016-11-14 DIAGNOSIS — N390 Urinary tract infection, site not specified: Secondary | ICD-10-CM | POA: Diagnosis not present

## 2016-11-14 DIAGNOSIS — R809 Proteinuria, unspecified: Secondary | ICD-10-CM | POA: Diagnosis not present

## 2016-11-14 DIAGNOSIS — E1129 Type 2 diabetes mellitus with other diabetic kidney complication: Secondary | ICD-10-CM | POA: Diagnosis not present

## 2016-11-14 DIAGNOSIS — D631 Anemia in chronic kidney disease: Secondary | ICD-10-CM | POA: Diagnosis not present

## 2016-11-15 DIAGNOSIS — R279 Unspecified lack of coordination: Secondary | ICD-10-CM | POA: Diagnosis not present

## 2016-11-15 DIAGNOSIS — N183 Chronic kidney disease, stage 3 (moderate): Secondary | ICD-10-CM | POA: Diagnosis not present

## 2016-11-15 DIAGNOSIS — R2681 Unsteadiness on feet: Secondary | ICD-10-CM | POA: Diagnosis not present

## 2016-11-15 DIAGNOSIS — R531 Weakness: Secondary | ICD-10-CM | POA: Diagnosis not present

## 2016-11-15 DIAGNOSIS — N189 Chronic kidney disease, unspecified: Secondary | ICD-10-CM | POA: Diagnosis not present

## 2016-11-15 DIAGNOSIS — Z9181 History of falling: Secondary | ICD-10-CM | POA: Diagnosis not present

## 2016-11-19 DIAGNOSIS — R2689 Other abnormalities of gait and mobility: Secondary | ICD-10-CM | POA: Diagnosis not present

## 2016-11-19 DIAGNOSIS — R531 Weakness: Secondary | ICD-10-CM | POA: Diagnosis not present

## 2016-11-19 DIAGNOSIS — R2681 Unsteadiness on feet: Secondary | ICD-10-CM | POA: Diagnosis not present

## 2016-11-19 DIAGNOSIS — R42 Dizziness and giddiness: Secondary | ICD-10-CM | POA: Diagnosis not present

## 2016-11-19 DIAGNOSIS — R279 Unspecified lack of coordination: Secondary | ICD-10-CM | POA: Diagnosis not present

## 2016-11-21 DIAGNOSIS — E871 Hypo-osmolality and hyponatremia: Secondary | ICD-10-CM | POA: Diagnosis not present

## 2016-11-21 DIAGNOSIS — R531 Weakness: Secondary | ICD-10-CM | POA: Diagnosis not present

## 2016-11-21 DIAGNOSIS — R2689 Other abnormalities of gait and mobility: Secondary | ICD-10-CM | POA: Diagnosis not present

## 2016-11-21 DIAGNOSIS — R2681 Unsteadiness on feet: Secondary | ICD-10-CM | POA: Diagnosis not present

## 2016-11-21 DIAGNOSIS — R42 Dizziness and giddiness: Secondary | ICD-10-CM | POA: Diagnosis not present

## 2016-11-21 DIAGNOSIS — R279 Unspecified lack of coordination: Secondary | ICD-10-CM | POA: Diagnosis not present

## 2016-11-26 DIAGNOSIS — R2681 Unsteadiness on feet: Secondary | ICD-10-CM | POA: Diagnosis not present

## 2016-11-26 DIAGNOSIS — R531 Weakness: Secondary | ICD-10-CM | POA: Diagnosis not present

## 2016-11-26 DIAGNOSIS — R2689 Other abnormalities of gait and mobility: Secondary | ICD-10-CM | POA: Diagnosis not present

## 2016-11-26 DIAGNOSIS — R42 Dizziness and giddiness: Secondary | ICD-10-CM | POA: Diagnosis not present

## 2016-11-26 DIAGNOSIS — R279 Unspecified lack of coordination: Secondary | ICD-10-CM | POA: Diagnosis not present

## 2016-11-28 DIAGNOSIS — R2689 Other abnormalities of gait and mobility: Secondary | ICD-10-CM | POA: Diagnosis not present

## 2016-11-28 DIAGNOSIS — R531 Weakness: Secondary | ICD-10-CM | POA: Diagnosis not present

## 2016-11-28 DIAGNOSIS — R279 Unspecified lack of coordination: Secondary | ICD-10-CM | POA: Diagnosis not present

## 2016-11-28 DIAGNOSIS — R42 Dizziness and giddiness: Secondary | ICD-10-CM | POA: Diagnosis not present

## 2016-11-28 DIAGNOSIS — R2681 Unsteadiness on feet: Secondary | ICD-10-CM | POA: Diagnosis not present

## 2016-11-29 DIAGNOSIS — I10 Essential (primary) hypertension: Secondary | ICD-10-CM | POA: Diagnosis not present

## 2016-11-29 DIAGNOSIS — E118 Type 2 diabetes mellitus with unspecified complications: Secondary | ICD-10-CM | POA: Diagnosis not present

## 2016-11-29 DIAGNOSIS — G4700 Insomnia, unspecified: Secondary | ICD-10-CM | POA: Diagnosis not present

## 2016-12-02 DIAGNOSIS — R279 Unspecified lack of coordination: Secondary | ICD-10-CM | POA: Diagnosis not present

## 2016-12-02 DIAGNOSIS — R2689 Other abnormalities of gait and mobility: Secondary | ICD-10-CM | POA: Diagnosis not present

## 2016-12-02 DIAGNOSIS — R2681 Unsteadiness on feet: Secondary | ICD-10-CM | POA: Diagnosis not present

## 2016-12-02 DIAGNOSIS — R531 Weakness: Secondary | ICD-10-CM | POA: Diagnosis not present

## 2016-12-02 DIAGNOSIS — R42 Dizziness and giddiness: Secondary | ICD-10-CM | POA: Diagnosis not present

## 2016-12-03 DIAGNOSIS — X32XXXD Exposure to sunlight, subsequent encounter: Secondary | ICD-10-CM | POA: Diagnosis not present

## 2016-12-03 DIAGNOSIS — Z08 Encounter for follow-up examination after completed treatment for malignant neoplasm: Secondary | ICD-10-CM | POA: Diagnosis not present

## 2016-12-03 DIAGNOSIS — Z85828 Personal history of other malignant neoplasm of skin: Secondary | ICD-10-CM | POA: Diagnosis not present

## 2016-12-03 DIAGNOSIS — L570 Actinic keratosis: Secondary | ICD-10-CM | POA: Diagnosis not present

## 2016-12-05 DIAGNOSIS — R279 Unspecified lack of coordination: Secondary | ICD-10-CM | POA: Diagnosis not present

## 2016-12-05 DIAGNOSIS — R531 Weakness: Secondary | ICD-10-CM | POA: Diagnosis not present

## 2016-12-05 DIAGNOSIS — R2681 Unsteadiness on feet: Secondary | ICD-10-CM | POA: Diagnosis not present

## 2016-12-05 DIAGNOSIS — R2689 Other abnormalities of gait and mobility: Secondary | ICD-10-CM | POA: Diagnosis not present

## 2016-12-05 DIAGNOSIS — R42 Dizziness and giddiness: Secondary | ICD-10-CM | POA: Diagnosis not present

## 2016-12-10 DIAGNOSIS — R2681 Unsteadiness on feet: Secondary | ICD-10-CM | POA: Diagnosis not present

## 2016-12-10 DIAGNOSIS — R279 Unspecified lack of coordination: Secondary | ICD-10-CM | POA: Diagnosis not present

## 2016-12-10 DIAGNOSIS — R531 Weakness: Secondary | ICD-10-CM | POA: Diagnosis not present

## 2016-12-10 DIAGNOSIS — R2689 Other abnormalities of gait and mobility: Secondary | ICD-10-CM | POA: Diagnosis not present

## 2016-12-10 DIAGNOSIS — R42 Dizziness and giddiness: Secondary | ICD-10-CM | POA: Diagnosis not present

## 2016-12-16 DIAGNOSIS — S638X2D Sprain of other part of left wrist and hand, subsequent encounter: Secondary | ICD-10-CM | POA: Diagnosis not present

## 2016-12-23 DIAGNOSIS — N184 Chronic kidney disease, stage 4 (severe): Secondary | ICD-10-CM | POA: Diagnosis not present

## 2016-12-23 DIAGNOSIS — R809 Proteinuria, unspecified: Secondary | ICD-10-CM | POA: Diagnosis not present

## 2016-12-24 DIAGNOSIS — M25531 Pain in right wrist: Secondary | ICD-10-CM | POA: Diagnosis not present

## 2016-12-26 DIAGNOSIS — N184 Chronic kidney disease, stage 4 (severe): Secondary | ICD-10-CM | POA: Diagnosis not present

## 2016-12-26 DIAGNOSIS — N179 Acute kidney failure, unspecified: Secondary | ICD-10-CM | POA: Diagnosis not present

## 2016-12-26 DIAGNOSIS — E871 Hypo-osmolality and hyponatremia: Secondary | ICD-10-CM | POA: Diagnosis not present

## 2016-12-26 DIAGNOSIS — E1129 Type 2 diabetes mellitus with other diabetic kidney complication: Secondary | ICD-10-CM | POA: Diagnosis not present

## 2016-12-26 DIAGNOSIS — R809 Proteinuria, unspecified: Secondary | ICD-10-CM | POA: Diagnosis not present

## 2016-12-26 DIAGNOSIS — I129 Hypertensive chronic kidney disease with stage 1 through stage 4 chronic kidney disease, or unspecified chronic kidney disease: Secondary | ICD-10-CM | POA: Diagnosis not present

## 2016-12-30 DIAGNOSIS — S6991XA Unspecified injury of right wrist, hand and finger(s), initial encounter: Secondary | ICD-10-CM | POA: Diagnosis not present

## 2016-12-30 DIAGNOSIS — L03113 Cellulitis of right upper limb: Secondary | ICD-10-CM | POA: Diagnosis not present

## 2016-12-30 DIAGNOSIS — Z7984 Long term (current) use of oral hypoglycemic drugs: Secondary | ICD-10-CM | POA: Diagnosis not present

## 2016-12-30 DIAGNOSIS — E1129 Type 2 diabetes mellitus with other diabetic kidney complication: Secondary | ICD-10-CM | POA: Diagnosis not present

## 2017-01-03 DIAGNOSIS — L258 Unspecified contact dermatitis due to other agents: Secondary | ICD-10-CM | POA: Diagnosis not present

## 2017-01-22 DIAGNOSIS — N179 Acute kidney failure, unspecified: Secondary | ICD-10-CM | POA: Diagnosis not present

## 2017-01-25 ENCOUNTER — Telehealth: Payer: Self-pay | Admitting: *Deleted

## 2017-01-25 ENCOUNTER — Ambulatory Visit (INDEPENDENT_AMBULATORY_CARE_PROVIDER_SITE_OTHER): Payer: Medicare Other | Admitting: Physician Assistant

## 2017-01-25 VITALS — BP 184/62 | HR 55 | Temp 97.8°F | Ht 63.5 in

## 2017-01-25 DIAGNOSIS — R030 Elevated blood-pressure reading, without diagnosis of hypertension: Secondary | ICD-10-CM | POA: Diagnosis not present

## 2017-01-25 DIAGNOSIS — Z1329 Encounter for screening for other suspected endocrine disorder: Secondary | ICD-10-CM | POA: Diagnosis not present

## 2017-01-25 DIAGNOSIS — N3000 Acute cystitis without hematuria: Secondary | ICD-10-CM | POA: Diagnosis not present

## 2017-01-25 DIAGNOSIS — I10 Essential (primary) hypertension: Secondary | ICD-10-CM | POA: Diagnosis not present

## 2017-01-25 LAB — POCT CBC
Granulocyte percent: 73.9 %G (ref 37–80)
HCT, POC: 26.8 % — AB (ref 37.7–47.9)
Hemoglobin: 9 g/dL — AB (ref 12.2–16.2)
Lymph, poc: 1.3 (ref 0.6–3.4)
MCH, POC: 29.2 pg (ref 27–31.2)
MCHC: 33.4 g/dL (ref 31.8–35.4)
MCV: 87.2 fL (ref 80–97)
MID (cbc): 0.3 (ref 0–0.9)
MPV: 5.9 fL (ref 0–99.8)
POC Granulocyte: 4.6 (ref 2–6.9)
POC LYMPH PERCENT: 21.1 %L (ref 10–50)
POC MID %: 5 %M (ref 0–12)
Platelet Count, POC: 278 10*3/uL (ref 142–424)
RBC: 3.08 M/uL — AB (ref 4.04–5.48)
RDW, POC: 12.8 %
WBC: 6.2 10*3/uL (ref 4.6–10.2)

## 2017-01-25 LAB — POCT URINALYSIS DIP (MANUAL ENTRY)
Bilirubin, UA: NEGATIVE
Blood, UA: NEGATIVE
Glucose, UA: NEGATIVE mg/dL
Ketones, POC UA: NEGATIVE mg/dL
Nitrite, UA: NEGATIVE
Protein Ur, POC: 100 mg/dL — AB
Spec Grav, UA: 1.02 (ref 1.010–1.025)
Urobilinogen, UA: 0.2 E.U./dL
pH, UA: 6 (ref 5.0–8.0)

## 2017-01-25 MED ORDER — AMLODIPINE BESYLATE 5 MG PO TABS: 5.0000 mg | ORAL_TABLET | Freq: Every day | ORAL | 0 refills | Status: AC

## 2017-01-25 MED ORDER — AMLODIPINE BESYLATE 5 MG PO TABS: 5.0000 mg | ORAL_TABLET | Freq: Every day | ORAL | 0 refills | Status: DC

## 2017-01-25 NOTE — Telephone Encounter (Signed)
[facility] [phone], spoke with Abigail Butts, cancelled Rx, Norvasc 5 mg, per W. McVey.

## 2017-01-25 NOTE — Patient Instructions (Signed)
Please see attached form. The hilited medications look to be what you should be taking according to your last office visit note from [facility]. Please call your PCP office to double check.   You will receive a phone call to schedule an appt with cardiology

## 2017-01-25 NOTE — Progress Notes (Signed)
Rose Cruz  MRN: [id number] DOB: 13-Nov-1932  PCP: Leandrew Koyanagi, MD (Inactive)  Subjective:  Pt is a delightful 83 year old female PMH HTN, DM, TIA, HLD, who presents to clinic for elevated blood pressure reading. She had a home BP of 200/70 today. In office today is 209/76, recheck 184/62. She is asymptomatic. Denies headache, chest pain, vision changes, dizziness, syncope.  Denies medication changes. In reviewing her last OV note from her nephrologist at [facility] with Dr. Audie Clear, she should be taking Catapress 0.57m bid, Hyzaar 100-12.571mqd and Norvasc 27m80md.  She states she is no longer taking Norvasc 27mg70m - has been out for 1 week. No refills on this medication. She has appt with her kidney doctor in 4 days.  She does not like her currenct PCP "he doesn't like country folk" She plans on seeing a new PVP Nov 1 - Dr. Kim Maudie Mercuryh [facility].  In reviewing her recent medical records she has brought with her today she has not had thyroid checked in several years.   Review of Systems  Constitutional: Negative for appetite change and diaphoresis.  Respiratory: Negative for cough, chest tightness, shortness of breath and wheezing.   Cardiovascular: Negative for chest pain and palpitations.  Gastrointestinal: Negative for abdominal pain, nausea and vomiting.  Musculoskeletal: Negative for arthralgias, back pain and neck pain.  Neurological: Negative for dizziness, syncope, weakness, light-headedness and headaches.  Psychiatric/Behavioral: Negative for agitation and confusion. The patient is not nervous/anxious.     Patient Active Problem List   Diagnosis Date Noted  . Diabetes (HCC)[place]/29/2017  . Varicose veins 01/24/2012  . Hyperlipidemia 01/24/2012  . Anemia-chronic normocytic 01/24/2012  . Colonic polyp-colonoscopy 2004 01/24/2012  . Chronic back pain 01/24/2012  . Microalbuminuria-2008 01/24/2012  . B12 deficiency-2009 01/24/2012  . Full  dentures-age 64 01/24/2012  . TIA (transient ischemic attack)-01/13/10 01/24/2012  . BMI 28.0-28.9,adult 01/22/2012  . HTN (hypertension) 01/01/2012  . Diabetes mellitus with complication (HCC)[place]/198/33/8250Current Outpatient Prescriptions on File Prior to Visit  Medication Sig Dispense Refill  . aspirin 81 MG tablet Take 81 mg by mouth daily.    . Ferrous Sulfate Dried 200 (65 FE) MG TABS Take by mouth.    . losartan-hydrochlorothiazide (HYZAAR) 100-25 MG tablet Take 1 tablet by mouth daily. 30 tablet 1  . metFORMIN (GLUCOPHAGE-XR) 500 MG 24 hr tablet Take 1 tablet (500 mg total) by mouth daily with breakfast. 90 tablet 3  . Multiple Vitamin (MULTIVITAMIN) tablet Take 1 tablet by mouth daily.    . nitrofurantoin, macrocrystal-monohydrate, (MACROBID) 100 MG capsule Take 1 capsule (100 mg total) by mouth 2 (two) times daily. 14 capsule 0  . verapamil (CALAN-SR) 180 MG CR tablet Take 1 tablet (180 mg total) by mouth at bedtime. 90 tablet 3   No current facility-administered medications on file prior to visit.     Allergies  Allergen Reactions  . Penicillins     Whelps, swelling  . Statins     Legs hurt     Objective:  There were no vitals taken for this visit.  Physical Exam  Constitutional: She is oriented to person, place, and time and well-developed, well-nourished, and in no distress. She does not have a sickly appearance. No distress.  Eyes: Pupils are equal, round, and reactive to light. Conjunctivae are normal.  Cardiovascular: Normal rate, regular rhythm and normal heart sounds.   Neurological: She is alert and oriented to  person, place, and time. GCS score is 15.  Skin: Skin is warm and dry. She is not diaphoretic.  Psychiatric: Mood, memory, affect and judgment normal.  Vitals reviewed.  Results for orders placed or performed in visit on 01/25/17  POCT urinalysis dipstick  Result Value Ref Range   Color, UA yellow yellow   Clarity, UA cloudy (A) clear   Glucose,  UA negative negative mg/dL   Bilirubin, UA negative negative   Ketones, POC UA negative negative mg/dL   Spec Grav, UA 1.020 1.010 - 1.025   Blood, UA negative negative   pH, UA 6.0 5.0 - 8.0   Protein Ur, POC =100 (A) negative mg/dL   Urobilinogen, UA 0.2 0.2 or 1.0 E.U./dL   Nitrite, UA Negative Negative   Leukocytes, UA Large (3+) (A) Negative  POCT CBC  Result Value Ref Range   WBC 6.2 4.6 - 10.2 K/uL   Lymph, poc 1.3 0.6 - 3.4   POC LYMPH PERCENT 21.1 10 - 50 %L   MID (cbc) 0.3 0 - 0.9   POC MID % 5.0 0 - 12 %M   POC Granulocyte 4.6 2 - 6.9   Granulocyte percent 73.9 37 - 80 %G   RBC 3.08 (A) 4.04 - 5.48 M/uL   Hemoglobin 9.0 (A) 12.2 - 16.2 g/dL   HCT, POC 26.8 (A) 37.7 - 47.9 %   MCV 87.2 80 - 97 fL   MCH, POC 29.2 27 - 31.2 pg   MCHC 33.4 31.8 - 35.4 g/dL   RDW, POC 12.8 %   Platelet Count, POC 278 142 - 424 K/uL   MPV 5.9 0 - 99.8 fL   Lab Results  Component Value Date   TSH 1.146 01/05/2014    EKG - right bundle branch block. No prior EKGs on file for comparison.  Assessment and Plan :  This case was discussed with Dr. Carlota Raspberry 1. Elevated blood pressure reading - EKG 12-Lead - CMP14+EGFR - POCT urinalysis dipstick - POCT CBC - Ambulatory referral to Cardiology - Last TSH 2015 wnl. Plan to check today to r/o thyroid disease.  2. Essential hypertension - amLODipine (NORVASC) 5 MG tablet; Take 1 tablet (5 mg total) by mouth daily.  Dispense: 90 tablet; Refill: 0 - Pt is asymptomatic and does not appear to be in distress. EKG shows right BBB. Her blood pressures at home have been 150-170's/90 recently. Today's blood pressure is not much of a deviation from that normal. Suspect increase in blood pressure 2/2 pt not refilling Norvasc 5 mg. Plan to refill this for her. She has f/u appt with her nephrologist in a few days. She has never been evaluated by cardiology. Will refer for eval. F/u with her PCP. She will check home blood pressures. RTC if readings remain high.  She agrees with plan.  3. Screening for thyroid disorder - TSH  4. Acute cystitis without hematuria - Urine Culture - Large leukocytes on UA. She is asymptomatic, negative WBC count. Will await culture to treat.   Mercer Pod, PA-C  [facility] 01/25/2017 9:39 AM

## 2017-01-26 LAB — CMP14+EGFR
ALT: 16 IU/L (ref 0–32)
AST: 23 IU/L (ref 0–40)
Albumin/Globulin Ratio: 1.3 (ref 1.2–2.2)
Albumin: 3.7 g/dL (ref 3.5–4.7)
Alkaline Phosphatase: 82 IU/L (ref 39–117)
BUN/Creatinine Ratio: 19 (ref 12–28)
BUN: 31 mg/dL — ABNORMAL HIGH (ref 8–27)
Bilirubin Total: 0.2 mg/dL (ref 0.0–1.2)
CO2: 22 mmol/L (ref 20–29)
Calcium: 9.6 mg/dL (ref 8.7–10.3)
Chloride: 93 mmol/L — ABNORMAL LOW (ref 96–106)
Creatinine, Ser: 1.67 mg/dL — ABNORMAL HIGH (ref 0.57–1.00)
GFR calc Af Amer: 33 mL/min/{1.73_m2} — ABNORMAL LOW (ref >59)
GFR calc non Af Amer: 28 mL/min/{1.73_m2} — ABNORMAL LOW (ref >59)
Globulin, Total: 2.8 g/dL (ref 1.5–4.5)
Glucose: 138 mg/dL — ABNORMAL HIGH (ref 65–99)
Potassium: 4.5 mmol/L (ref 3.5–5.2)
Sodium: 129 mmol/L — ABNORMAL LOW (ref 134–144)
Total Protein: 6.5 g/dL (ref 6.0–8.5)

## 2017-01-26 LAB — TSH: TSH: 1.66 u[IU]/mL (ref 0.450–4.500)

## 2017-01-27 LAB — URINE CULTURE

## 2017-01-28 ENCOUNTER — Other Ambulatory Visit: Payer: Self-pay | Admitting: Physician Assistant

## 2017-01-28 DIAGNOSIS — E1129 Type 2 diabetes mellitus with other diabetic kidney complication: Secondary | ICD-10-CM | POA: Diagnosis not present

## 2017-01-28 DIAGNOSIS — N184 Chronic kidney disease, stage 4 (severe): Secondary | ICD-10-CM | POA: Diagnosis not present

## 2017-01-28 DIAGNOSIS — E871 Hypo-osmolality and hyponatremia: Secondary | ICD-10-CM | POA: Diagnosis not present

## 2017-01-28 DIAGNOSIS — R809 Proteinuria, unspecified: Secondary | ICD-10-CM | POA: Diagnosis not present

## 2017-01-28 DIAGNOSIS — I129 Hypertensive chronic kidney disease with stage 1 through stage 4 chronic kidney disease, or unspecified chronic kidney disease: Secondary | ICD-10-CM | POA: Diagnosis not present

## 2017-02-12 DIAGNOSIS — Z8673 Personal history of transient ischemic attack (TIA), and cerebral infarction without residual deficits: Secondary | ICD-10-CM | POA: Diagnosis not present

## 2017-02-12 DIAGNOSIS — E782 Mixed hyperlipidemia: Secondary | ICD-10-CM | POA: Diagnosis not present

## 2017-02-12 DIAGNOSIS — I10 Essential (primary) hypertension: Secondary | ICD-10-CM | POA: Diagnosis not present

## 2017-02-12 DIAGNOSIS — N184 Chronic kidney disease, stage 4 (severe): Secondary | ICD-10-CM | POA: Diagnosis not present

## 2017-02-13 ENCOUNTER — Encounter: Payer: Self-pay | Admitting: Internal Medicine

## 2017-02-13 DIAGNOSIS — E119 Type 2 diabetes mellitus without complications: Secondary | ICD-10-CM | POA: Diagnosis not present

## 2017-02-13 DIAGNOSIS — I10 Essential (primary) hypertension: Secondary | ICD-10-CM | POA: Diagnosis not present

## 2017-02-13 DIAGNOSIS — Z0000 Encounter for general adult medical examination without abnormal findings: Secondary | ICD-10-CM | POA: Diagnosis not present

## 2017-02-13 DIAGNOSIS — Z23 Encounter for immunization: Secondary | ICD-10-CM | POA: Diagnosis not present

## 2017-02-21 DIAGNOSIS — E871 Hypo-osmolality and hyponatremia: Secondary | ICD-10-CM | POA: Diagnosis not present

## 2017-02-21 DIAGNOSIS — N184 Chronic kidney disease, stage 4 (severe): Secondary | ICD-10-CM | POA: Diagnosis not present

## 2017-02-24 DIAGNOSIS — E119 Type 2 diabetes mellitus without complications: Secondary | ICD-10-CM | POA: Diagnosis not present

## 2017-02-24 DIAGNOSIS — I10 Essential (primary) hypertension: Secondary | ICD-10-CM | POA: Diagnosis not present

## 2017-02-27 DIAGNOSIS — I129 Hypertensive chronic kidney disease with stage 1 through stage 4 chronic kidney disease, or unspecified chronic kidney disease: Secondary | ICD-10-CM | POA: Diagnosis not present

## 2017-02-27 DIAGNOSIS — E871 Hypo-osmolality and hyponatremia: Secondary | ICD-10-CM | POA: Diagnosis not present

## 2017-02-27 DIAGNOSIS — E1122 Type 2 diabetes mellitus with diabetic chronic kidney disease: Secondary | ICD-10-CM | POA: Diagnosis not present

## 2017-02-27 DIAGNOSIS — N184 Chronic kidney disease, stage 4 (severe): Secondary | ICD-10-CM | POA: Diagnosis not present

## 2017-03-20 DIAGNOSIS — R609 Edema, unspecified: Secondary | ICD-10-CM | POA: Diagnosis not present

## 2017-04-14 DIAGNOSIS — N189 Chronic kidney disease, unspecified: Secondary | ICD-10-CM | POA: Diagnosis not present

## 2017-04-14 DIAGNOSIS — E119 Type 2 diabetes mellitus without complications: Secondary | ICD-10-CM | POA: Diagnosis not present

## 2017-04-14 DIAGNOSIS — I10 Essential (primary) hypertension: Secondary | ICD-10-CM | POA: Diagnosis not present

## 2017-04-14 DIAGNOSIS — E7800 Pure hypercholesterolemia, unspecified: Secondary | ICD-10-CM | POA: Diagnosis not present

## 2017-04-14 DIAGNOSIS — I639 Cerebral infarction, unspecified: Secondary | ICD-10-CM | POA: Diagnosis not present

## 2017-04-25 DIAGNOSIS — E119 Type 2 diabetes mellitus without complications: Secondary | ICD-10-CM | POA: Diagnosis not present

## 2017-04-25 DIAGNOSIS — I10 Essential (primary) hypertension: Secondary | ICD-10-CM | POA: Diagnosis not present

## 2017-04-25 DIAGNOSIS — R011 Cardiac murmur, unspecified: Secondary | ICD-10-CM | POA: Diagnosis not present

## 2017-05-13 DIAGNOSIS — I129 Hypertensive chronic kidney disease with stage 1 through stage 4 chronic kidney disease, or unspecified chronic kidney disease: Secondary | ICD-10-CM | POA: Diagnosis not present

## 2017-05-13 DIAGNOSIS — E1140 Type 2 diabetes mellitus with diabetic neuropathy, unspecified: Secondary | ICD-10-CM | POA: Diagnosis not present

## 2017-05-13 DIAGNOSIS — D649 Anemia, unspecified: Secondary | ICD-10-CM | POA: Diagnosis not present

## 2017-05-13 DIAGNOSIS — D638 Anemia in other chronic diseases classified elsewhere: Secondary | ICD-10-CM | POA: Diagnosis not present

## 2017-05-13 DIAGNOSIS — Z0001 Encounter for general adult medical examination with abnormal findings: Secondary | ICD-10-CM | POA: Diagnosis not present

## 2017-05-13 DIAGNOSIS — E785 Hyperlipidemia, unspecified: Secondary | ICD-10-CM | POA: Diagnosis not present

## 2017-05-13 DIAGNOSIS — R7301 Impaired fasting glucose: Secondary | ICD-10-CM | POA: Diagnosis not present

## 2017-05-20 DIAGNOSIS — E1140 Type 2 diabetes mellitus with diabetic neuropathy, unspecified: Secondary | ICD-10-CM | POA: Diagnosis not present

## 2017-05-20 DIAGNOSIS — I129 Hypertensive chronic kidney disease with stage 1 through stage 4 chronic kidney disease, or unspecified chronic kidney disease: Secondary | ICD-10-CM | POA: Diagnosis not present

## 2017-05-20 DIAGNOSIS — D638 Anemia in other chronic diseases classified elsewhere: Secondary | ICD-10-CM | POA: Diagnosis not present

## 2017-05-20 DIAGNOSIS — E785 Hyperlipidemia, unspecified: Secondary | ICD-10-CM | POA: Diagnosis not present

## 2017-06-02 DIAGNOSIS — N184 Chronic kidney disease, stage 4 (severe): Secondary | ICD-10-CM | POA: Diagnosis not present

## 2017-06-05 DIAGNOSIS — E1122 Type 2 diabetes mellitus with diabetic chronic kidney disease: Secondary | ICD-10-CM | POA: Diagnosis not present

## 2017-06-05 DIAGNOSIS — N184 Chronic kidney disease, stage 4 (severe): Secondary | ICD-10-CM | POA: Diagnosis not present

## 2017-06-05 DIAGNOSIS — I129 Hypertensive chronic kidney disease with stage 1 through stage 4 chronic kidney disease, or unspecified chronic kidney disease: Secondary | ICD-10-CM | POA: Diagnosis not present

## 2017-06-06 DIAGNOSIS — L308 Other specified dermatitis: Secondary | ICD-10-CM | POA: Diagnosis not present

## 2017-06-06 DIAGNOSIS — L570 Actinic keratosis: Secondary | ICD-10-CM | POA: Diagnosis not present

## 2017-06-06 DIAGNOSIS — X32XXXD Exposure to sunlight, subsequent encounter: Secondary | ICD-10-CM | POA: Diagnosis not present

## 2017-07-22 DIAGNOSIS — E1140 Type 2 diabetes mellitus with diabetic neuropathy, unspecified: Secondary | ICD-10-CM | POA: Diagnosis not present

## 2017-07-22 DIAGNOSIS — I129 Hypertensive chronic kidney disease with stage 1 through stage 4 chronic kidney disease, or unspecified chronic kidney disease: Secondary | ICD-10-CM | POA: Diagnosis not present

## 2017-07-22 DIAGNOSIS — I10 Essential (primary) hypertension: Secondary | ICD-10-CM | POA: Diagnosis not present

## 2017-07-22 DIAGNOSIS — E119 Type 2 diabetes mellitus without complications: Secondary | ICD-10-CM | POA: Diagnosis not present

## 2017-07-22 DIAGNOSIS — D638 Anemia in other chronic diseases classified elsewhere: Secondary | ICD-10-CM | POA: Diagnosis not present

## 2017-07-22 DIAGNOSIS — E785 Hyperlipidemia, unspecified: Secondary | ICD-10-CM | POA: Diagnosis not present

## 2017-09-01 DIAGNOSIS — N184 Chronic kidney disease, stage 4 (severe): Secondary | ICD-10-CM | POA: Diagnosis not present

## 2017-09-04 DIAGNOSIS — E1122 Type 2 diabetes mellitus with diabetic chronic kidney disease: Secondary | ICD-10-CM | POA: Diagnosis not present

## 2017-09-04 DIAGNOSIS — N179 Acute kidney failure, unspecified: Secondary | ICD-10-CM | POA: Diagnosis not present

## 2017-09-04 DIAGNOSIS — N184 Chronic kidney disease, stage 4 (severe): Secondary | ICD-10-CM | POA: Diagnosis not present

## 2017-09-04 DIAGNOSIS — I129 Hypertensive chronic kidney disease with stage 1 through stage 4 chronic kidney disease, or unspecified chronic kidney disease: Secondary | ICD-10-CM | POA: Diagnosis not present

## 2017-09-05 DIAGNOSIS — I129 Hypertensive chronic kidney disease with stage 1 through stage 4 chronic kidney disease, or unspecified chronic kidney disease: Secondary | ICD-10-CM | POA: Diagnosis not present

## 2017-09-05 DIAGNOSIS — I10 Essential (primary) hypertension: Secondary | ICD-10-CM | POA: Diagnosis not present

## 2017-09-05 DIAGNOSIS — D638 Anemia in other chronic diseases classified elsewhere: Secondary | ICD-10-CM | POA: Diagnosis not present

## 2017-09-05 DIAGNOSIS — E1140 Type 2 diabetes mellitus with diabetic neuropathy, unspecified: Secondary | ICD-10-CM | POA: Diagnosis not present

## 2017-09-05 DIAGNOSIS — E785 Hyperlipidemia, unspecified: Secondary | ICD-10-CM | POA: Diagnosis not present

## 2017-09-05 DIAGNOSIS — R7301 Impaired fasting glucose: Secondary | ICD-10-CM | POA: Diagnosis not present

## 2017-09-05 DIAGNOSIS — M545 Low back pain: Secondary | ICD-10-CM | POA: Diagnosis not present

## 2017-09-09 DIAGNOSIS — M654 Radial styloid tenosynovitis [de Quervain]: Secondary | ICD-10-CM | POA: Diagnosis not present

## 2017-10-03 DIAGNOSIS — X32XXXD Exposure to sunlight, subsequent encounter: Secondary | ICD-10-CM | POA: Diagnosis not present

## 2017-10-03 DIAGNOSIS — E119 Type 2 diabetes mellitus without complications: Secondary | ICD-10-CM | POA: Diagnosis not present

## 2017-10-03 DIAGNOSIS — L308 Other specified dermatitis: Secondary | ICD-10-CM | POA: Diagnosis not present

## 2017-10-03 DIAGNOSIS — L570 Actinic keratosis: Secondary | ICD-10-CM | POA: Diagnosis not present

## 2017-10-06 DIAGNOSIS — L249 Irritant contact dermatitis, unspecified cause: Secondary | ICD-10-CM | POA: Diagnosis not present

## 2017-10-06 DIAGNOSIS — E119 Type 2 diabetes mellitus without complications: Secondary | ICD-10-CM | POA: Diagnosis not present

## 2017-10-06 DIAGNOSIS — I10 Essential (primary) hypertension: Secondary | ICD-10-CM | POA: Diagnosis not present

## 2017-10-06 DIAGNOSIS — I129 Hypertensive chronic kidney disease with stage 1 through stage 4 chronic kidney disease, or unspecified chronic kidney disease: Secondary | ICD-10-CM | POA: Diagnosis not present

## 2017-10-06 DIAGNOSIS — N189 Chronic kidney disease, unspecified: Secondary | ICD-10-CM | POA: Diagnosis not present

## 2017-10-13 DIAGNOSIS — E1140 Type 2 diabetes mellitus with diabetic neuropathy, unspecified: Secondary | ICD-10-CM | POA: Diagnosis not present

## 2017-10-13 DIAGNOSIS — L209 Atopic dermatitis, unspecified: Secondary | ICD-10-CM | POA: Diagnosis not present

## 2017-10-13 DIAGNOSIS — E785 Hyperlipidemia, unspecified: Secondary | ICD-10-CM | POA: Diagnosis not present

## 2017-10-13 DIAGNOSIS — I129 Hypertensive chronic kidney disease with stage 1 through stage 4 chronic kidney disease, or unspecified chronic kidney disease: Secondary | ICD-10-CM | POA: Diagnosis not present

## 2017-10-20 DIAGNOSIS — D631 Anemia in chronic kidney disease: Secondary | ICD-10-CM | POA: Diagnosis not present

## 2017-10-20 DIAGNOSIS — N179 Acute kidney failure, unspecified: Secondary | ICD-10-CM | POA: Diagnosis not present

## 2017-10-20 DIAGNOSIS — N184 Chronic kidney disease, stage 4 (severe): Secondary | ICD-10-CM | POA: Diagnosis not present

## 2017-10-24 DIAGNOSIS — D631 Anemia in chronic kidney disease: Secondary | ICD-10-CM | POA: Diagnosis not present

## 2017-10-24 DIAGNOSIS — I129 Hypertensive chronic kidney disease with stage 1 through stage 4 chronic kidney disease, or unspecified chronic kidney disease: Secondary | ICD-10-CM | POA: Diagnosis not present

## 2017-10-24 DIAGNOSIS — N184 Chronic kidney disease, stage 4 (severe): Secondary | ICD-10-CM | POA: Diagnosis not present

## 2017-10-24 DIAGNOSIS — E1122 Type 2 diabetes mellitus with diabetic chronic kidney disease: Secondary | ICD-10-CM | POA: Diagnosis not present

## 2017-10-24 DIAGNOSIS — N179 Acute kidney failure, unspecified: Secondary | ICD-10-CM | POA: Diagnosis not present

## 2017-10-24 DIAGNOSIS — E871 Hypo-osmolality and hyponatremia: Secondary | ICD-10-CM | POA: Diagnosis not present

## 2017-11-17 DIAGNOSIS — E785 Hyperlipidemia, unspecified: Secondary | ICD-10-CM | POA: Diagnosis not present

## 2017-11-17 DIAGNOSIS — E1140 Type 2 diabetes mellitus with diabetic neuropathy, unspecified: Secondary | ICD-10-CM | POA: Diagnosis not present

## 2017-11-17 DIAGNOSIS — I129 Hypertensive chronic kidney disease with stage 1 through stage 4 chronic kidney disease, or unspecified chronic kidney disease: Secondary | ICD-10-CM | POA: Diagnosis not present

## 2017-11-17 DIAGNOSIS — L209 Atopic dermatitis, unspecified: Secondary | ICD-10-CM | POA: Diagnosis not present

## 2017-11-25 DIAGNOSIS — L308 Other specified dermatitis: Secondary | ICD-10-CM | POA: Diagnosis not present

## 2017-11-26 DIAGNOSIS — I129 Hypertensive chronic kidney disease with stage 1 through stage 4 chronic kidney disease, or unspecified chronic kidney disease: Secondary | ICD-10-CM | POA: Diagnosis not present

## 2017-11-26 DIAGNOSIS — Z23 Encounter for immunization: Secondary | ICD-10-CM | POA: Diagnosis not present

## 2017-11-26 DIAGNOSIS — E1140 Type 2 diabetes mellitus with diabetic neuropathy, unspecified: Secondary | ICD-10-CM | POA: Diagnosis not present

## 2017-11-26 DIAGNOSIS — Z283 Underimmunization status: Secondary | ICD-10-CM | POA: Diagnosis not present

## 2017-12-23 DIAGNOSIS — N184 Chronic kidney disease, stage 4 (severe): Secondary | ICD-10-CM | POA: Diagnosis not present

## 2017-12-26 DIAGNOSIS — N184 Chronic kidney disease, stage 4 (severe): Secondary | ICD-10-CM | POA: Diagnosis not present

## 2017-12-26 DIAGNOSIS — E1122 Type 2 diabetes mellitus with diabetic chronic kidney disease: Secondary | ICD-10-CM | POA: Diagnosis not present

## 2017-12-26 DIAGNOSIS — N179 Acute kidney failure, unspecified: Secondary | ICD-10-CM | POA: Diagnosis not present

## 2017-12-26 DIAGNOSIS — I129 Hypertensive chronic kidney disease with stage 1 through stage 4 chronic kidney disease, or unspecified chronic kidney disease: Secondary | ICD-10-CM | POA: Diagnosis not present

## 2017-12-26 DIAGNOSIS — D631 Anemia in chronic kidney disease: Secondary | ICD-10-CM | POA: Diagnosis not present

## 2017-12-26 DIAGNOSIS — E871 Hypo-osmolality and hyponatremia: Secondary | ICD-10-CM | POA: Diagnosis not present

## 2017-12-29 DIAGNOSIS — I129 Hypertensive chronic kidney disease with stage 1 through stage 4 chronic kidney disease, or unspecified chronic kidney disease: Secondary | ICD-10-CM | POA: Diagnosis not present

## 2017-12-29 DIAGNOSIS — E785 Hyperlipidemia, unspecified: Secondary | ICD-10-CM | POA: Diagnosis not present

## 2017-12-29 DIAGNOSIS — E119 Type 2 diabetes mellitus without complications: Secondary | ICD-10-CM | POA: Diagnosis not present

## 2017-12-29 DIAGNOSIS — E1140 Type 2 diabetes mellitus with diabetic neuropathy, unspecified: Secondary | ICD-10-CM | POA: Diagnosis not present

## 2018-01-30 DIAGNOSIS — N184 Chronic kidney disease, stage 4 (severe): Secondary | ICD-10-CM | POA: Diagnosis not present

## 2018-01-30 DIAGNOSIS — N179 Acute kidney failure, unspecified: Secondary | ICD-10-CM | POA: Diagnosis not present

## 2018-02-03 DIAGNOSIS — N179 Acute kidney failure, unspecified: Secondary | ICD-10-CM | POA: Diagnosis not present

## 2018-02-03 DIAGNOSIS — N184 Chronic kidney disease, stage 4 (severe): Secondary | ICD-10-CM | POA: Diagnosis not present

## 2018-02-03 DIAGNOSIS — E1122 Type 2 diabetes mellitus with diabetic chronic kidney disease: Secondary | ICD-10-CM | POA: Diagnosis not present

## 2018-02-03 DIAGNOSIS — I129 Hypertensive chronic kidney disease with stage 1 through stage 4 chronic kidney disease, or unspecified chronic kidney disease: Secondary | ICD-10-CM | POA: Diagnosis not present

## 2018-02-05 DIAGNOSIS — E1122 Type 2 diabetes mellitus with diabetic chronic kidney disease: Secondary | ICD-10-CM | POA: Diagnosis not present

## 2018-02-05 DIAGNOSIS — N184 Chronic kidney disease, stage 4 (severe): Secondary | ICD-10-CM | POA: Diagnosis not present

## 2018-02-05 DIAGNOSIS — Z79899 Other long term (current) drug therapy: Secondary | ICD-10-CM | POA: Diagnosis not present

## 2018-03-02 DIAGNOSIS — E103211 Type 1 diabetes mellitus with mild nonproliferative diabetic retinopathy with macular edema, right eye: Secondary | ICD-10-CM | POA: Diagnosis not present

## 2018-03-03 ENCOUNTER — Encounter (INDEPENDENT_AMBULATORY_CARE_PROVIDER_SITE_OTHER): Payer: Medicare Other | Admitting: Ophthalmology

## 2018-03-03 DIAGNOSIS — E11311 Type 2 diabetes mellitus with unspecified diabetic retinopathy with macular edema: Secondary | ICD-10-CM | POA: Diagnosis not present

## 2018-03-03 DIAGNOSIS — H43813 Vitreous degeneration, bilateral: Secondary | ICD-10-CM

## 2018-03-03 DIAGNOSIS — I10 Essential (primary) hypertension: Secondary | ICD-10-CM | POA: Diagnosis not present

## 2018-03-03 DIAGNOSIS — H35033 Hypertensive retinopathy, bilateral: Secondary | ICD-10-CM | POA: Diagnosis not present

## 2018-03-03 DIAGNOSIS — E113313 Type 2 diabetes mellitus with moderate nonproliferative diabetic retinopathy with macular edema, bilateral: Secondary | ICD-10-CM | POA: Diagnosis not present

## 2018-03-17 DIAGNOSIS — N179 Acute kidney failure, unspecified: Secondary | ICD-10-CM | POA: Diagnosis not present

## 2018-03-17 DIAGNOSIS — N184 Chronic kidney disease, stage 4 (severe): Secondary | ICD-10-CM | POA: Diagnosis not present

## 2018-03-20 DIAGNOSIS — D631 Anemia in chronic kidney disease: Secondary | ICD-10-CM | POA: Diagnosis not present

## 2018-03-20 DIAGNOSIS — N184 Chronic kidney disease, stage 4 (severe): Secondary | ICD-10-CM | POA: Diagnosis not present

## 2018-03-20 DIAGNOSIS — I129 Hypertensive chronic kidney disease with stage 1 through stage 4 chronic kidney disease, or unspecified chronic kidney disease: Secondary | ICD-10-CM | POA: Diagnosis not present

## 2018-03-20 DIAGNOSIS — E1122 Type 2 diabetes mellitus with diabetic chronic kidney disease: Secondary | ICD-10-CM | POA: Diagnosis not present

## 2018-03-31 ENCOUNTER — Encounter (INDEPENDENT_AMBULATORY_CARE_PROVIDER_SITE_OTHER): Payer: Medicare Other | Admitting: Ophthalmology

## 2018-03-31 DIAGNOSIS — H35033 Hypertensive retinopathy, bilateral: Secondary | ICD-10-CM

## 2018-03-31 DIAGNOSIS — H43813 Vitreous degeneration, bilateral: Secondary | ICD-10-CM | POA: Diagnosis not present

## 2018-03-31 DIAGNOSIS — I10 Essential (primary) hypertension: Secondary | ICD-10-CM | POA: Diagnosis not present

## 2018-03-31 DIAGNOSIS — E113313 Type 2 diabetes mellitus with moderate nonproliferative diabetic retinopathy with macular edema, bilateral: Secondary | ICD-10-CM

## 2018-03-31 DIAGNOSIS — E11311 Type 2 diabetes mellitus with unspecified diabetic retinopathy with macular edema: Secondary | ICD-10-CM

## 2018-04-28 ENCOUNTER — Encounter (INDEPENDENT_AMBULATORY_CARE_PROVIDER_SITE_OTHER): Payer: Medicare Other | Admitting: Ophthalmology

## 2018-04-30 ENCOUNTER — Encounter (INDEPENDENT_AMBULATORY_CARE_PROVIDER_SITE_OTHER): Payer: Medicare Other | Admitting: Ophthalmology

## 2018-04-30 DIAGNOSIS — H43813 Vitreous degeneration, bilateral: Secondary | ICD-10-CM

## 2018-04-30 DIAGNOSIS — E113313 Type 2 diabetes mellitus with moderate nonproliferative diabetic retinopathy with macular edema, bilateral: Secondary | ICD-10-CM | POA: Diagnosis not present

## 2018-04-30 DIAGNOSIS — I10 Essential (primary) hypertension: Secondary | ICD-10-CM

## 2018-04-30 DIAGNOSIS — E11311 Type 2 diabetes mellitus with unspecified diabetic retinopathy with macular edema: Secondary | ICD-10-CM | POA: Diagnosis not present

## 2018-04-30 DIAGNOSIS — H35033 Hypertensive retinopathy, bilateral: Secondary | ICD-10-CM | POA: Diagnosis not present

## 2018-05-05 DIAGNOSIS — N184 Chronic kidney disease, stage 4 (severe): Secondary | ICD-10-CM | POA: Diagnosis not present

## 2018-05-05 DIAGNOSIS — D631 Anemia in chronic kidney disease: Secondary | ICD-10-CM | POA: Diagnosis not present

## 2018-05-07 DIAGNOSIS — E1122 Type 2 diabetes mellitus with diabetic chronic kidney disease: Secondary | ICD-10-CM | POA: Diagnosis not present

## 2018-05-07 DIAGNOSIS — N184 Chronic kidney disease, stage 4 (severe): Secondary | ICD-10-CM | POA: Diagnosis not present

## 2018-05-07 DIAGNOSIS — R809 Proteinuria, unspecified: Secondary | ICD-10-CM | POA: Diagnosis not present

## 2018-05-07 DIAGNOSIS — E871 Hypo-osmolality and hyponatremia: Secondary | ICD-10-CM | POA: Diagnosis not present

## 2018-05-07 DIAGNOSIS — D631 Anemia in chronic kidney disease: Secondary | ICD-10-CM | POA: Diagnosis not present

## 2018-05-07 DIAGNOSIS — N179 Acute kidney failure, unspecified: Secondary | ICD-10-CM | POA: Diagnosis not present

## 2018-05-07 DIAGNOSIS — I129 Hypertensive chronic kidney disease with stage 1 through stage 4 chronic kidney disease, or unspecified chronic kidney disease: Secondary | ICD-10-CM | POA: Diagnosis not present

## 2018-05-08 DIAGNOSIS — E1122 Type 2 diabetes mellitus with diabetic chronic kidney disease: Secondary | ICD-10-CM | POA: Diagnosis not present

## 2018-05-08 DIAGNOSIS — E785 Hyperlipidemia, unspecified: Secondary | ICD-10-CM | POA: Diagnosis not present

## 2018-05-08 DIAGNOSIS — E113593 Type 2 diabetes mellitus with proliferative diabetic retinopathy without macular edema, bilateral: Secondary | ICD-10-CM | POA: Diagnosis not present

## 2018-05-08 DIAGNOSIS — I129 Hypertensive chronic kidney disease with stage 1 through stage 4 chronic kidney disease, or unspecified chronic kidney disease: Secondary | ICD-10-CM | POA: Diagnosis not present

## 2018-05-08 DIAGNOSIS — N184 Chronic kidney disease, stage 4 (severe): Secondary | ICD-10-CM | POA: Diagnosis not present

## 2018-05-26 ENCOUNTER — Encounter (INDEPENDENT_AMBULATORY_CARE_PROVIDER_SITE_OTHER): Payer: Medicare Other | Admitting: Ophthalmology

## 2018-05-26 DIAGNOSIS — H35033 Hypertensive retinopathy, bilateral: Secondary | ICD-10-CM | POA: Diagnosis not present

## 2018-05-26 DIAGNOSIS — H43813 Vitreous degeneration, bilateral: Secondary | ICD-10-CM | POA: Diagnosis not present

## 2018-05-26 DIAGNOSIS — E10311 Type 1 diabetes mellitus with unspecified diabetic retinopathy with macular edema: Secondary | ICD-10-CM | POA: Diagnosis not present

## 2018-05-26 DIAGNOSIS — I10 Essential (primary) hypertension: Secondary | ICD-10-CM

## 2018-05-26 DIAGNOSIS — E103313 Type 1 diabetes mellitus with moderate nonproliferative diabetic retinopathy with macular edema, bilateral: Secondary | ICD-10-CM

## 2018-06-09 DIAGNOSIS — N179 Acute kidney failure, unspecified: Secondary | ICD-10-CM | POA: Diagnosis not present

## 2018-06-09 DIAGNOSIS — D631 Anemia in chronic kidney disease: Secondary | ICD-10-CM | POA: Diagnosis not present

## 2018-06-09 DIAGNOSIS — N184 Chronic kidney disease, stage 4 (severe): Secondary | ICD-10-CM | POA: Diagnosis not present

## 2018-06-12 DIAGNOSIS — E1122 Type 2 diabetes mellitus with diabetic chronic kidney disease: Secondary | ICD-10-CM | POA: Diagnosis not present

## 2018-06-12 DIAGNOSIS — D631 Anemia in chronic kidney disease: Secondary | ICD-10-CM | POA: Diagnosis not present

## 2018-06-12 DIAGNOSIS — I129 Hypertensive chronic kidney disease with stage 1 through stage 4 chronic kidney disease, or unspecified chronic kidney disease: Secondary | ICD-10-CM | POA: Diagnosis not present

## 2018-06-12 DIAGNOSIS — N184 Chronic kidney disease, stage 4 (severe): Secondary | ICD-10-CM | POA: Diagnosis not present

## 2018-06-12 DIAGNOSIS — R809 Proteinuria, unspecified: Secondary | ICD-10-CM | POA: Diagnosis not present

## 2018-06-14 DIAGNOSIS — I10 Essential (primary) hypertension: Secondary | ICD-10-CM | POA: Diagnosis not present

## 2018-06-14 DIAGNOSIS — I129 Hypertensive chronic kidney disease with stage 1 through stage 4 chronic kidney disease, or unspecified chronic kidney disease: Secondary | ICD-10-CM | POA: Diagnosis not present

## 2018-06-14 DIAGNOSIS — N184 Chronic kidney disease, stage 4 (severe): Secondary | ICD-10-CM | POA: Diagnosis not present

## 2018-06-14 DIAGNOSIS — Z79899 Other long term (current) drug therapy: Secondary | ICD-10-CM | POA: Diagnosis not present

## 2018-06-14 DIAGNOSIS — E1122 Type 2 diabetes mellitus with diabetic chronic kidney disease: Secondary | ICD-10-CM | POA: Diagnosis not present

## 2018-06-23 ENCOUNTER — Encounter (INDEPENDENT_AMBULATORY_CARE_PROVIDER_SITE_OTHER): Payer: Medicare Other | Admitting: Ophthalmology

## 2018-06-23 DIAGNOSIS — E11311 Type 2 diabetes mellitus with unspecified diabetic retinopathy with macular edema: Secondary | ICD-10-CM | POA: Diagnosis not present

## 2018-06-23 DIAGNOSIS — E113313 Type 2 diabetes mellitus with moderate nonproliferative diabetic retinopathy with macular edema, bilateral: Secondary | ICD-10-CM | POA: Diagnosis not present

## 2018-06-23 DIAGNOSIS — H43813 Vitreous degeneration, bilateral: Secondary | ICD-10-CM

## 2018-06-23 DIAGNOSIS — H35033 Hypertensive retinopathy, bilateral: Secondary | ICD-10-CM | POA: Diagnosis not present

## 2018-06-23 DIAGNOSIS — I10 Essential (primary) hypertension: Secondary | ICD-10-CM

## 2018-06-25 DIAGNOSIS — N184 Chronic kidney disease, stage 4 (severe): Secondary | ICD-10-CM | POA: Diagnosis not present

## 2018-06-25 DIAGNOSIS — D631 Anemia in chronic kidney disease: Secondary | ICD-10-CM | POA: Diagnosis not present

## 2018-07-09 DIAGNOSIS — N184 Chronic kidney disease, stage 4 (severe): Secondary | ICD-10-CM | POA: Diagnosis not present

## 2018-07-09 DIAGNOSIS — D631 Anemia in chronic kidney disease: Secondary | ICD-10-CM | POA: Diagnosis not present

## 2018-07-14 DIAGNOSIS — D631 Anemia in chronic kidney disease: Secondary | ICD-10-CM | POA: Diagnosis not present

## 2018-07-14 DIAGNOSIS — I129 Hypertensive chronic kidney disease with stage 1 through stage 4 chronic kidney disease, or unspecified chronic kidney disease: Secondary | ICD-10-CM | POA: Diagnosis not present

## 2018-07-14 DIAGNOSIS — N184 Chronic kidney disease, stage 4 (severe): Secondary | ICD-10-CM | POA: Diagnosis not present

## 2018-07-14 DIAGNOSIS — E1122 Type 2 diabetes mellitus with diabetic chronic kidney disease: Secondary | ICD-10-CM | POA: Diagnosis not present

## 2018-07-28 ENCOUNTER — Encounter (INDEPENDENT_AMBULATORY_CARE_PROVIDER_SITE_OTHER): Payer: Medicare Other | Admitting: Ophthalmology

## 2018-07-29 ENCOUNTER — Other Ambulatory Visit: Payer: Self-pay

## 2018-07-29 ENCOUNTER — Encounter (INDEPENDENT_AMBULATORY_CARE_PROVIDER_SITE_OTHER): Payer: Medicare Other | Admitting: Ophthalmology

## 2018-07-29 DIAGNOSIS — E11311 Type 2 diabetes mellitus with unspecified diabetic retinopathy with macular edema: Secondary | ICD-10-CM | POA: Diagnosis not present

## 2018-07-29 DIAGNOSIS — E113313 Type 2 diabetes mellitus with moderate nonproliferative diabetic retinopathy with macular edema, bilateral: Secondary | ICD-10-CM

## 2018-07-29 DIAGNOSIS — H43813 Vitreous degeneration, bilateral: Secondary | ICD-10-CM

## 2018-07-29 DIAGNOSIS — H35033 Hypertensive retinopathy, bilateral: Secondary | ICD-10-CM

## 2018-07-29 DIAGNOSIS — I10 Essential (primary) hypertension: Secondary | ICD-10-CM

## 2018-09-02 ENCOUNTER — Other Ambulatory Visit: Payer: Self-pay

## 2018-09-02 ENCOUNTER — Encounter (INDEPENDENT_AMBULATORY_CARE_PROVIDER_SITE_OTHER): Payer: Medicare Other | Admitting: Ophthalmology

## 2018-09-10 DIAGNOSIS — D631 Anemia in chronic kidney disease: Secondary | ICD-10-CM | POA: Diagnosis not present

## 2018-09-10 DIAGNOSIS — N184 Chronic kidney disease, stage 4 (severe): Secondary | ICD-10-CM | POA: Diagnosis not present

## 2018-09-16 DIAGNOSIS — N184 Chronic kidney disease, stage 4 (severe): Secondary | ICD-10-CM | POA: Diagnosis not present

## 2018-09-16 DIAGNOSIS — Z7984 Long term (current) use of oral hypoglycemic drugs: Secondary | ICD-10-CM | POA: Diagnosis not present

## 2018-09-16 DIAGNOSIS — E1122 Type 2 diabetes mellitus with diabetic chronic kidney disease: Secondary | ICD-10-CM | POA: Diagnosis not present

## 2018-09-16 DIAGNOSIS — I129 Hypertensive chronic kidney disease with stage 1 through stage 4 chronic kidney disease, or unspecified chronic kidney disease: Secondary | ICD-10-CM | POA: Diagnosis not present

## 2018-09-16 DIAGNOSIS — D631 Anemia in chronic kidney disease: Secondary | ICD-10-CM | POA: Diagnosis not present

## 2018-10-01 DIAGNOSIS — D631 Anemia in chronic kidney disease: Secondary | ICD-10-CM | POA: Diagnosis not present

## 2018-10-01 DIAGNOSIS — N184 Chronic kidney disease, stage 4 (severe): Secondary | ICD-10-CM | POA: Diagnosis not present

## 2018-10-08 ENCOUNTER — Encounter (INDEPENDENT_AMBULATORY_CARE_PROVIDER_SITE_OTHER): Payer: Medicare Other | Admitting: Ophthalmology

## 2018-10-08 ENCOUNTER — Other Ambulatory Visit: Payer: Self-pay

## 2018-10-08 DIAGNOSIS — H35033 Hypertensive retinopathy, bilateral: Secondary | ICD-10-CM | POA: Diagnosis not present

## 2018-10-08 DIAGNOSIS — I10 Essential (primary) hypertension: Secondary | ICD-10-CM | POA: Diagnosis not present

## 2018-10-08 DIAGNOSIS — E113313 Type 2 diabetes mellitus with moderate nonproliferative diabetic retinopathy with macular edema, bilateral: Secondary | ICD-10-CM

## 2018-10-08 DIAGNOSIS — E11311 Type 2 diabetes mellitus with unspecified diabetic retinopathy with macular edema: Secondary | ICD-10-CM | POA: Diagnosis not present

## 2018-10-08 DIAGNOSIS — H43813 Vitreous degeneration, bilateral: Secondary | ICD-10-CM | POA: Diagnosis not present

## 2018-10-29 DIAGNOSIS — D631 Anemia in chronic kidney disease: Secondary | ICD-10-CM | POA: Diagnosis not present

## 2018-10-29 DIAGNOSIS — N184 Chronic kidney disease, stage 4 (severe): Secondary | ICD-10-CM | POA: Diagnosis not present

## 2018-11-03 DIAGNOSIS — I129 Hypertensive chronic kidney disease with stage 1 through stage 4 chronic kidney disease, or unspecified chronic kidney disease: Secondary | ICD-10-CM | POA: Diagnosis not present

## 2018-11-03 DIAGNOSIS — D631 Anemia in chronic kidney disease: Secondary | ICD-10-CM | POA: Diagnosis not present

## 2018-11-03 DIAGNOSIS — N184 Chronic kidney disease, stage 4 (severe): Secondary | ICD-10-CM | POA: Diagnosis not present

## 2018-11-03 DIAGNOSIS — E1122 Type 2 diabetes mellitus with diabetic chronic kidney disease: Secondary | ICD-10-CM | POA: Diagnosis not present

## 2018-11-05 ENCOUNTER — Encounter (INDEPENDENT_AMBULATORY_CARE_PROVIDER_SITE_OTHER): Payer: Medicare Other | Admitting: Ophthalmology

## 2018-11-05 ENCOUNTER — Other Ambulatory Visit: Payer: Self-pay

## 2018-11-05 DIAGNOSIS — E11311 Type 2 diabetes mellitus with unspecified diabetic retinopathy with macular edema: Secondary | ICD-10-CM

## 2018-11-05 DIAGNOSIS — E113512 Type 2 diabetes mellitus with proliferative diabetic retinopathy with macular edema, left eye: Secondary | ICD-10-CM | POA: Diagnosis not present

## 2018-11-05 DIAGNOSIS — E113311 Type 2 diabetes mellitus with moderate nonproliferative diabetic retinopathy with macular edema, right eye: Secondary | ICD-10-CM

## 2018-11-05 DIAGNOSIS — I10 Essential (primary) hypertension: Secondary | ICD-10-CM

## 2018-11-05 DIAGNOSIS — H43813 Vitreous degeneration, bilateral: Secondary | ICD-10-CM | POA: Diagnosis not present

## 2018-11-05 DIAGNOSIS — H35033 Hypertensive retinopathy, bilateral: Secondary | ICD-10-CM

## 2018-11-17 DIAGNOSIS — D631 Anemia in chronic kidney disease: Secondary | ICD-10-CM | POA: Diagnosis not present

## 2018-11-17 DIAGNOSIS — N184 Chronic kidney disease, stage 4 (severe): Secondary | ICD-10-CM | POA: Diagnosis not present

## 2018-12-08 DIAGNOSIS — D631 Anemia in chronic kidney disease: Secondary | ICD-10-CM | POA: Diagnosis not present

## 2018-12-08 DIAGNOSIS — N184 Chronic kidney disease, stage 4 (severe): Secondary | ICD-10-CM | POA: Diagnosis not present

## 2018-12-09 ENCOUNTER — Other Ambulatory Visit: Payer: Self-pay

## 2018-12-09 ENCOUNTER — Encounter (INDEPENDENT_AMBULATORY_CARE_PROVIDER_SITE_OTHER): Payer: Medicare Other | Admitting: Ophthalmology

## 2018-12-09 DIAGNOSIS — E113311 Type 2 diabetes mellitus with moderate nonproliferative diabetic retinopathy with macular edema, right eye: Secondary | ICD-10-CM | POA: Diagnosis not present

## 2018-12-09 DIAGNOSIS — H35033 Hypertensive retinopathy, bilateral: Secondary | ICD-10-CM | POA: Diagnosis not present

## 2018-12-09 DIAGNOSIS — E11311 Type 2 diabetes mellitus with unspecified diabetic retinopathy with macular edema: Secondary | ICD-10-CM

## 2018-12-09 DIAGNOSIS — E113512 Type 2 diabetes mellitus with proliferative diabetic retinopathy with macular edema, left eye: Secondary | ICD-10-CM

## 2018-12-09 DIAGNOSIS — H43813 Vitreous degeneration, bilateral: Secondary | ICD-10-CM | POA: Diagnosis not present

## 2018-12-09 DIAGNOSIS — I10 Essential (primary) hypertension: Secondary | ICD-10-CM

## 2018-12-15 DIAGNOSIS — D631 Anemia in chronic kidney disease: Secondary | ICD-10-CM | POA: Diagnosis not present

## 2018-12-15 DIAGNOSIS — E1122 Type 2 diabetes mellitus with diabetic chronic kidney disease: Secondary | ICD-10-CM | POA: Diagnosis not present

## 2018-12-15 DIAGNOSIS — I129 Hypertensive chronic kidney disease with stage 1 through stage 4 chronic kidney disease, or unspecified chronic kidney disease: Secondary | ICD-10-CM | POA: Diagnosis not present

## 2018-12-15 DIAGNOSIS — N184 Chronic kidney disease, stage 4 (severe): Secondary | ICD-10-CM | POA: Diagnosis not present

## 2019-01-13 ENCOUNTER — Other Ambulatory Visit: Payer: Self-pay

## 2019-01-13 ENCOUNTER — Encounter (INDEPENDENT_AMBULATORY_CARE_PROVIDER_SITE_OTHER): Payer: Medicare Other | Admitting: Ophthalmology

## 2019-01-13 DIAGNOSIS — H43813 Vitreous degeneration, bilateral: Secondary | ICD-10-CM | POA: Diagnosis not present

## 2019-01-13 DIAGNOSIS — I10 Essential (primary) hypertension: Secondary | ICD-10-CM | POA: Diagnosis not present

## 2019-01-13 DIAGNOSIS — E103513 Type 1 diabetes mellitus with proliferative diabetic retinopathy with macular edema, bilateral: Secondary | ICD-10-CM | POA: Diagnosis not present

## 2019-01-13 DIAGNOSIS — H35033 Hypertensive retinopathy, bilateral: Secondary | ICD-10-CM

## 2019-01-13 DIAGNOSIS — E10311 Type 1 diabetes mellitus with unspecified diabetic retinopathy with macular edema: Secondary | ICD-10-CM | POA: Diagnosis not present

## 2019-01-18 DIAGNOSIS — E1140 Type 2 diabetes mellitus with diabetic neuropathy, unspecified: Secondary | ICD-10-CM | POA: Diagnosis not present

## 2019-01-18 DIAGNOSIS — D638 Anemia in other chronic diseases classified elsewhere: Secondary | ICD-10-CM | POA: Diagnosis not present

## 2019-01-18 DIAGNOSIS — E785 Hyperlipidemia, unspecified: Secondary | ICD-10-CM | POA: Diagnosis not present

## 2019-01-18 DIAGNOSIS — Z23 Encounter for immunization: Secondary | ICD-10-CM | POA: Diagnosis not present

## 2019-01-18 DIAGNOSIS — I129 Hypertensive chronic kidney disease with stage 1 through stage 4 chronic kidney disease, or unspecified chronic kidney disease: Secondary | ICD-10-CM | POA: Diagnosis not present

## 2019-01-21 DIAGNOSIS — I129 Hypertensive chronic kidney disease with stage 1 through stage 4 chronic kidney disease, or unspecified chronic kidney disease: Secondary | ICD-10-CM | POA: Diagnosis not present

## 2019-01-21 DIAGNOSIS — E1140 Type 2 diabetes mellitus with diabetic neuropathy, unspecified: Secondary | ICD-10-CM | POA: Diagnosis not present

## 2019-01-21 DIAGNOSIS — Z23 Encounter for immunization: Secondary | ICD-10-CM | POA: Diagnosis not present

## 2019-01-22 ENCOUNTER — Other Ambulatory Visit (HOSPITAL_COMMUNITY): Payer: Self-pay | Admitting: Family Medicine

## 2019-01-22 DIAGNOSIS — I350 Nonrheumatic aortic (valve) stenosis: Secondary | ICD-10-CM

## 2019-01-25 ENCOUNTER — Other Ambulatory Visit: Payer: Self-pay

## 2019-01-25 ENCOUNTER — Ambulatory Visit (HOSPITAL_COMMUNITY): Payer: Medicare Other | Attending: Cardiology

## 2019-01-25 ENCOUNTER — Encounter (INDEPENDENT_AMBULATORY_CARE_PROVIDER_SITE_OTHER): Payer: Self-pay

## 2019-01-25 DIAGNOSIS — I350 Nonrheumatic aortic (valve) stenosis: Secondary | ICD-10-CM | POA: Insufficient documentation

## 2019-01-27 DIAGNOSIS — Z20828 Contact with and (suspected) exposure to other viral communicable diseases: Secondary | ICD-10-CM | POA: Diagnosis not present

## 2019-01-29 DIAGNOSIS — N184 Chronic kidney disease, stage 4 (severe): Secondary | ICD-10-CM | POA: Diagnosis not present

## 2019-01-29 DIAGNOSIS — E1122 Type 2 diabetes mellitus with diabetic chronic kidney disease: Secondary | ICD-10-CM | POA: Diagnosis not present

## 2019-01-29 DIAGNOSIS — I129 Hypertensive chronic kidney disease with stage 1 through stage 4 chronic kidney disease, or unspecified chronic kidney disease: Secondary | ICD-10-CM | POA: Diagnosis not present

## 2019-01-29 DIAGNOSIS — D631 Anemia in chronic kidney disease: Secondary | ICD-10-CM | POA: Diagnosis not present

## 2019-02-05 DIAGNOSIS — N184 Chronic kidney disease, stage 4 (severe): Secondary | ICD-10-CM | POA: Diagnosis not present

## 2019-02-05 DIAGNOSIS — D631 Anemia in chronic kidney disease: Secondary | ICD-10-CM | POA: Diagnosis not present

## 2019-02-17 ENCOUNTER — Encounter (INDEPENDENT_AMBULATORY_CARE_PROVIDER_SITE_OTHER): Payer: Medicare Other | Admitting: Ophthalmology

## 2019-02-17 DIAGNOSIS — E103513 Type 1 diabetes mellitus with proliferative diabetic retinopathy with macular edema, bilateral: Secondary | ICD-10-CM

## 2019-02-17 DIAGNOSIS — E10311 Type 1 diabetes mellitus with unspecified diabetic retinopathy with macular edema: Secondary | ICD-10-CM

## 2019-02-17 DIAGNOSIS — H43813 Vitreous degeneration, bilateral: Secondary | ICD-10-CM | POA: Diagnosis not present

## 2019-02-17 DIAGNOSIS — I10 Essential (primary) hypertension: Secondary | ICD-10-CM

## 2019-02-17 DIAGNOSIS — H35033 Hypertensive retinopathy, bilateral: Secondary | ICD-10-CM

## 2019-02-18 DIAGNOSIS — N184 Chronic kidney disease, stage 4 (severe): Secondary | ICD-10-CM | POA: Diagnosis not present

## 2019-02-18 DIAGNOSIS — D631 Anemia in chronic kidney disease: Secondary | ICD-10-CM | POA: Diagnosis not present

## 2019-02-19 DIAGNOSIS — N184 Chronic kidney disease, stage 4 (severe): Secondary | ICD-10-CM | POA: Diagnosis not present

## 2019-02-19 DIAGNOSIS — D631 Anemia in chronic kidney disease: Secondary | ICD-10-CM | POA: Diagnosis not present

## 2019-03-04 DIAGNOSIS — E1122 Type 2 diabetes mellitus with diabetic chronic kidney disease: Secondary | ICD-10-CM | POA: Diagnosis not present

## 2019-03-04 DIAGNOSIS — I129 Hypertensive chronic kidney disease with stage 1 through stage 4 chronic kidney disease, or unspecified chronic kidney disease: Secondary | ICD-10-CM | POA: Diagnosis not present

## 2019-03-04 DIAGNOSIS — D631 Anemia in chronic kidney disease: Secondary | ICD-10-CM | POA: Diagnosis not present

## 2019-03-04 DIAGNOSIS — N184 Chronic kidney disease, stage 4 (severe): Secondary | ICD-10-CM | POA: Diagnosis not present

## 2019-03-18 DIAGNOSIS — D631 Anemia in chronic kidney disease: Secondary | ICD-10-CM | POA: Diagnosis not present

## 2019-03-18 DIAGNOSIS — N184 Chronic kidney disease, stage 4 (severe): Secondary | ICD-10-CM | POA: Diagnosis not present

## 2019-03-24 ENCOUNTER — Encounter (INDEPENDENT_AMBULATORY_CARE_PROVIDER_SITE_OTHER): Payer: Medicare Other | Admitting: Ophthalmology

## 2019-03-24 DIAGNOSIS — E103513 Type 1 diabetes mellitus with proliferative diabetic retinopathy with macular edema, bilateral: Secondary | ICD-10-CM | POA: Diagnosis not present

## 2019-03-24 DIAGNOSIS — E10311 Type 1 diabetes mellitus with unspecified diabetic retinopathy with macular edema: Secondary | ICD-10-CM

## 2019-03-24 DIAGNOSIS — I10 Essential (primary) hypertension: Secondary | ICD-10-CM

## 2019-03-24 DIAGNOSIS — H35033 Hypertensive retinopathy, bilateral: Secondary | ICD-10-CM

## 2019-03-24 DIAGNOSIS — H43813 Vitreous degeneration, bilateral: Secondary | ICD-10-CM | POA: Diagnosis not present

## 2019-04-28 ENCOUNTER — Encounter (INDEPENDENT_AMBULATORY_CARE_PROVIDER_SITE_OTHER): Payer: Medicare Other | Admitting: Ophthalmology

## 2019-04-28 ENCOUNTER — Other Ambulatory Visit: Payer: Self-pay

## 2019-04-28 DIAGNOSIS — E113511 Type 2 diabetes mellitus with proliferative diabetic retinopathy with macular edema, right eye: Secondary | ICD-10-CM

## 2019-04-28 DIAGNOSIS — E113592 Type 2 diabetes mellitus with proliferative diabetic retinopathy without macular edema, left eye: Secondary | ICD-10-CM

## 2019-04-28 DIAGNOSIS — E11311 Type 2 diabetes mellitus with unspecified diabetic retinopathy with macular edema: Secondary | ICD-10-CM

## 2019-04-28 DIAGNOSIS — H43813 Vitreous degeneration, bilateral: Secondary | ICD-10-CM

## 2019-04-28 DIAGNOSIS — H35033 Hypertensive retinopathy, bilateral: Secondary | ICD-10-CM | POA: Diagnosis not present

## 2019-04-28 DIAGNOSIS — I10 Essential (primary) hypertension: Secondary | ICD-10-CM | POA: Diagnosis not present

## 2019-05-07 DIAGNOSIS — N2581 Secondary hyperparathyroidism of renal origin: Secondary | ICD-10-CM | POA: Diagnosis not present

## 2019-05-07 DIAGNOSIS — D631 Anemia in chronic kidney disease: Secondary | ICD-10-CM | POA: Diagnosis not present

## 2019-05-07 DIAGNOSIS — N184 Chronic kidney disease, stage 4 (severe): Secondary | ICD-10-CM | POA: Diagnosis not present

## 2019-05-12 DIAGNOSIS — Z7984 Long term (current) use of oral hypoglycemic drugs: Secondary | ICD-10-CM | POA: Diagnosis not present

## 2019-05-12 DIAGNOSIS — D631 Anemia in chronic kidney disease: Secondary | ICD-10-CM | POA: Diagnosis not present

## 2019-05-12 DIAGNOSIS — E1122 Type 2 diabetes mellitus with diabetic chronic kidney disease: Secondary | ICD-10-CM | POA: Diagnosis not present

## 2019-05-12 DIAGNOSIS — N184 Chronic kidney disease, stage 4 (severe): Secondary | ICD-10-CM | POA: Diagnosis not present

## 2019-05-12 DIAGNOSIS — N2581 Secondary hyperparathyroidism of renal origin: Secondary | ICD-10-CM | POA: Diagnosis not present

## 2019-05-12 DIAGNOSIS — I129 Hypertensive chronic kidney disease with stage 1 through stage 4 chronic kidney disease, or unspecified chronic kidney disease: Secondary | ICD-10-CM | POA: Diagnosis not present

## 2019-05-15 ENCOUNTER — Ambulatory Visit: Payer: Medicare Other

## 2019-05-17 ENCOUNTER — Ambulatory Visit
Admission: EM | Admit: 2019-05-17 | Discharge: 2019-05-17 | Disposition: A | Discharge status: Home / Self Care | Payer: Medicare Other

## 2019-05-17 DIAGNOSIS — Z711 Person with feared health complaint in whom no diagnosis is made: Secondary | ICD-10-CM | POA: Diagnosis not present

## 2019-05-17 NOTE — ED Notes (Signed)
Pt states received her Iron shot on Wednesday and is the past has had some diarrhea afterwards.

## 2019-05-17 NOTE — ED Provider Notes (Signed)
[facility]    CSN: [id number] Arrival date & time: 05/17/19  0905      History   Chief Complaint Chief Complaint  Patient presents with  . Diarrhea    HPI Rose Cruz is a 86 y.o. female.   86 year old female with history DM, HTN, CVA comes in for COVID testing. States had few episodes of diarrhea yesterday. However, since then has had normal stools. Tmax 98.7, states this is higher than her normal of 97 and therefore called it a fever. Denies body aches, chills. Denies abdominal pain, nausea, vomiting. Denies shortness of breath, loss of taste/smell. Denies URI symptoms such as cough, congestion, sore throat. Scheduled for her COVID vaccine in 5 days and wanted to make sure she could still receive it. She had iron injections last week, which can cause diarrhea for her at times.      Past Medical History:  Diagnosis Date  . Diabetes mellitus without complication ([place])   . Hypertension   . Stroke [facility])     Patient Active Problem List   Diagnosis Date Noted  . Diabetes ([place]) 07/12/2015  . Varicose veins 01/24/2012  . Hyperlipidemia 01/24/2012  . Anemia-chronic normocytic 01/24/2012  . Colonic polyp-colonoscopy 2004 01/24/2012  . Chronic back pain 01/24/2012  . Microalbuminuria-2008 01/24/2012  . B12 deficiency-2009 01/24/2012  . Full dentures-age 72 01/24/2012  . TIA (transient ischemic attack)-01/13/10 01/24/2012  . BMI 28.0-28.9,adult 01/22/2012  . HTN (hypertension) 01/01/2012  . Diabetes mellitus with complication ([place]) 02/54/2706    Past Surgical History:  Procedure Laterality Date  . COLON SURGERY     polypectomy 1004  . EYE SURGERY     cataract  . FRACTURE SURGERY    . SPINE SURGERY     Dr Carloyn Manner 2006  . TUBAL LIGATION      OB History   No obstetric history on file.      Home Medications    Prior to Admission medications   Medication Sig Start Date End Date Taking? Authorizing Provider  atorvastatin (LIPITOR) 40 MG tablet Take 40  mg by mouth daily.   Yes [provider]  cloNIDine (CATAPRES) 0.1 MG tablet Take 0.1 mg by mouth 2 (two) times daily.   Yes [provider]  glimepiride (AMARYL) 2 MG tablet Take 2 mg by mouth daily with breakfast.   Yes [provider]  hydrALAZINE (APRESOLINE) 25 MG tablet Take 25 mg by mouth 3 (three) times daily.   Yes [provider]  amLODipine (NORVASC) 5 MG tablet Take 1 tablet (5 mg total) by mouth daily. Patient taking differently: Take 10 mg by mouth daily.  01/25/17   McVey, Gelene Mink, PA-C  aspirin 81 MG tablet Take 81 mg by mouth daily.    [provider]  Ferrous Sulfate Dried 200 (65 FE) MG TABS Take by mouth.    [provider]  losartan-hydrochlorothiazide (HYZAAR) 100-25 MG tablet Take 1 tablet by mouth daily. 05/31/16   Harrison Mons, PA  Multiple Vitamin (MULTIVITAMIN) tablet Take 1 tablet by mouth daily.    [provider]    Family History Family History  Problem Relation Age of Onset  . Cancer Mother   . Cancer Father   . Cancer Sister   . Cancer Brother     Social History Social History   Tobacco Use  . Smoking status: Never Smoker  . Smokeless tobacco: Never Used  Substance Use Topics  . Alcohol use: No  .  Drug use: No     Allergies   Penicillins and Statins   Review of Systems Review of Systems  Reason unable to perform ROS: See HPI as above.     Physical Exam Triage Vital Signs ED Triage Vitals  Enc Vitals Group     BP 05/17/19 0919 (!) 158/73     Pulse Rate 05/17/19 0919 70     Resp 05/17/19 0919 16     Temp 05/17/19 0919 98 F (36.7 C)     Temp Source 05/17/19 0919 Oral     SpO2 05/17/19 0919 96 %     Weight --      Height --      Head Circumference --      Peak Flow --      Pain Score 05/17/19 0930 0     Pain Loc --      Pain Edu? --      Excl. in [place]? --    No data found.  Updated Vital Signs BP (!) 158/73 (BP Location: Left Arm)   Pulse 70   Temp  98 F (36.7 C) (Oral)   Resp 16   SpO2 96%   Physical Exam Constitutional:      General: She is not in acute distress.    Appearance: Normal appearance. She is not ill-appearing, toxic-appearing or diaphoretic.  HENT:     Head: Normocephalic and atraumatic.     Mouth/Throat:     Mouth: Mucous membranes are moist.     Pharynx: Oropharynx is clear. Uvula midline.  Cardiovascular:     Rate and Rhythm: Normal rate and regular rhythm.     Heart sounds: Normal heart sounds. No murmur. No friction rub. No gallop.   Pulmonary:     Effort: Pulmonary effort is normal. No accessory muscle usage, prolonged expiration, respiratory distress or retractions.     Comments: Lungs clear to auscultation without adventitious lung sounds. Musculoskeletal:     Cervical back: Normal range of motion and neck supple.  Neurological:     General: No focal deficit present.     Mental Status: She is alert and oriented to person, place, and time.      UC Treatments / Results  Labs (all labs ordered are listed, but only abnormal results are displayed) Labs Reviewed - No data to display  EKG   Radiology No results found.  Procedures Procedures (including critical care time)  Medications Ordered in UC Medications - No data to display  Initial Impression / Assessment and Plan / UC Course  I have reviewed the triage vital signs and the nursing notes.  Pertinent labs & imaging results that were available during my care of the patient were reviewed by me and considered in my medical decision making (see chart for details).    Patient currently asymptomatic. Had few episode of diarrhea that resolved. No fever, abdominal pain, shortness of breath, URI symptoms. Low suspicion for COVID. Still offered COVID testing. After discussion, patient decided to defer for now and continue to monitor symptoms. Return precautions given. Patient expresses understanding and agrees to plan.  Final Clinical  Impressions(s) / UC Diagnoses   Final diagnoses:  Physically well but worried   ED Prescriptions    None     PDMP not reviewed this encounter.   Ok Edwards, PA-C 05/17/19 1002

## 2019-05-17 NOTE — Discharge Instructions (Signed)
No alarming signs and symptoms. Can proceed with vaccine on Friday. Continue to monitor symptoms. If develop other symptoms, may need testing.

## 2019-05-17 NOTE — ED Triage Notes (Signed)
Pt states had diarrhea yesterday with a fever. Pt states ate this am with no diarrhea. Pt states concern for COVID d/t getting her vaccine on Friday.

## 2019-05-21 ENCOUNTER — Ambulatory Visit: Payer: Medicare Other | Attending: Internal Medicine

## 2019-05-21 DIAGNOSIS — Z23 Encounter for immunization: Secondary | ICD-10-CM

## 2019-05-21 NOTE — Progress Notes (Signed)
   Covid-19 Vaccination Clinic  Name:  Rose Cruz    MRN: [id number] DOB: 07-Sep-1932  05/21/2019  [name] was observed post Covid-19 immunization for 15 minutes without incidence. She was provided with Vaccine Information Sheet and instruction to access the V-Safe system.   [name] was instructed to call 911 with any severe reactions post vaccine: Marland Kitchen Difficulty breathing  . Swelling of your face and throat  . A fast heartbeat  . A bad rash all over your body  . Dizziness and weakness    Immunizations Administered    Name Date Dose VIS Date Route   Pfizer COVID-19 Vaccine 05/21/2019  8:56 AM 0.3 mL 03/26/2019 Intramuscular   Manufacturer: [place]   Lot: [id number]   [place]: [id number]

## 2019-06-02 ENCOUNTER — Encounter (INDEPENDENT_AMBULATORY_CARE_PROVIDER_SITE_OTHER): Payer: Medicare Other | Admitting: Ophthalmology

## 2019-06-02 DIAGNOSIS — E10311 Type 1 diabetes mellitus with unspecified diabetic retinopathy with macular edema: Secondary | ICD-10-CM | POA: Diagnosis not present

## 2019-06-02 DIAGNOSIS — I10 Essential (primary) hypertension: Secondary | ICD-10-CM

## 2019-06-02 DIAGNOSIS — H43813 Vitreous degeneration, bilateral: Secondary | ICD-10-CM | POA: Diagnosis not present

## 2019-06-02 DIAGNOSIS — E103511 Type 1 diabetes mellitus with proliferative diabetic retinopathy with macular edema, right eye: Secondary | ICD-10-CM | POA: Diagnosis not present

## 2019-06-02 DIAGNOSIS — E103592 Type 1 diabetes mellitus with proliferative diabetic retinopathy without macular edema, left eye: Secondary | ICD-10-CM

## 2019-06-02 DIAGNOSIS — H35033 Hypertensive retinopathy, bilateral: Secondary | ICD-10-CM

## 2019-06-04 DIAGNOSIS — N184 Chronic kidney disease, stage 4 (severe): Secondary | ICD-10-CM | POA: Diagnosis not present

## 2019-06-04 DIAGNOSIS — D631 Anemia in chronic kidney disease: Secondary | ICD-10-CM | POA: Diagnosis not present

## 2019-06-15 ENCOUNTER — Ambulatory Visit: Payer: Medicare Other | Attending: Internal Medicine

## 2019-06-15 DIAGNOSIS — Z23 Encounter for immunization: Secondary | ICD-10-CM | POA: Insufficient documentation

## 2019-06-15 NOTE — Progress Notes (Signed)
   Covid-19 Vaccination Clinic  Name:  Rose Cruz    MRN: [id number] DOB: 30-Oct-1932  06/15/2019  Rose Cruz was observed post Covid-19 immunization for 15 minutes without incident. She was provided with Vaccine Information Sheet and instruction to access the V-Safe system.   Rose Cruz was instructed to call 911 with any severe reactions post vaccine: Marland Kitchen Difficulty breathing  . Swelling of face and throat  . A fast heartbeat  . A bad rash all over body  . Dizziness and weakness   Immunizations Administered    Name Date Dose VIS Date Route   Pfizer COVID-19 Vaccine 06/15/2019  9:17 AM 0.3 mL 03/26/2019 Intramuscular   Manufacturer: [place]   Lot: [id number]   [place]: [address]-4

## 2019-06-21 DIAGNOSIS — H40051 Ocular hypertension, right eye: Secondary | ICD-10-CM | POA: Diagnosis not present

## 2019-06-21 DIAGNOSIS — E113592 Type 2 diabetes mellitus with proliferative diabetic retinopathy without macular edema, left eye: Secondary | ICD-10-CM | POA: Diagnosis not present

## 2019-06-21 DIAGNOSIS — E113511 Type 2 diabetes mellitus with proliferative diabetic retinopathy with macular edema, right eye: Secondary | ICD-10-CM | POA: Diagnosis not present

## 2019-06-21 DIAGNOSIS — Z961 Presence of intraocular lens: Secondary | ICD-10-CM | POA: Diagnosis not present

## 2019-06-25 DIAGNOSIS — I129 Hypertensive chronic kidney disease with stage 1 through stage 4 chronic kidney disease, or unspecified chronic kidney disease: Secondary | ICD-10-CM | POA: Diagnosis not present

## 2019-06-25 DIAGNOSIS — D631 Anemia in chronic kidney disease: Secondary | ICD-10-CM | POA: Diagnosis not present

## 2019-06-25 DIAGNOSIS — N184 Chronic kidney disease, stage 4 (severe): Secondary | ICD-10-CM | POA: Diagnosis not present

## 2019-06-25 DIAGNOSIS — N2581 Secondary hyperparathyroidism of renal origin: Secondary | ICD-10-CM | POA: Diagnosis not present

## 2019-06-25 DIAGNOSIS — Z7984 Long term (current) use of oral hypoglycemic drugs: Secondary | ICD-10-CM | POA: Diagnosis not present

## 2019-06-25 DIAGNOSIS — E1122 Type 2 diabetes mellitus with diabetic chronic kidney disease: Secondary | ICD-10-CM | POA: Diagnosis not present

## 2019-07-07 ENCOUNTER — Encounter (INDEPENDENT_AMBULATORY_CARE_PROVIDER_SITE_OTHER): Payer: Medicare Other | Admitting: Ophthalmology

## 2019-07-07 DIAGNOSIS — H35033 Hypertensive retinopathy, bilateral: Secondary | ICD-10-CM | POA: Diagnosis not present

## 2019-07-07 DIAGNOSIS — H43813 Vitreous degeneration, bilateral: Secondary | ICD-10-CM

## 2019-07-07 DIAGNOSIS — E103511 Type 1 diabetes mellitus with proliferative diabetic retinopathy with macular edema, right eye: Secondary | ICD-10-CM | POA: Diagnosis not present

## 2019-07-07 DIAGNOSIS — E10311 Type 1 diabetes mellitus with unspecified diabetic retinopathy with macular edema: Secondary | ICD-10-CM

## 2019-07-07 DIAGNOSIS — I10 Essential (primary) hypertension: Secondary | ICD-10-CM

## 2019-07-07 DIAGNOSIS — E103592 Type 1 diabetes mellitus with proliferative diabetic retinopathy without macular edema, left eye: Secondary | ICD-10-CM | POA: Diagnosis not present

## 2019-08-03 DIAGNOSIS — N184 Chronic kidney disease, stage 4 (severe): Secondary | ICD-10-CM | POA: Diagnosis not present

## 2019-08-03 DIAGNOSIS — D631 Anemia in chronic kidney disease: Secondary | ICD-10-CM | POA: Diagnosis not present

## 2019-08-06 DIAGNOSIS — E1122 Type 2 diabetes mellitus with diabetic chronic kidney disease: Secondary | ICD-10-CM | POA: Diagnosis not present

## 2019-08-06 DIAGNOSIS — I120 Hypertensive chronic kidney disease with stage 5 chronic kidney disease or end stage renal disease: Secondary | ICD-10-CM | POA: Diagnosis not present

## 2019-08-06 DIAGNOSIS — D631 Anemia in chronic kidney disease: Secondary | ICD-10-CM | POA: Diagnosis not present

## 2019-08-06 DIAGNOSIS — N185 Chronic kidney disease, stage 5: Secondary | ICD-10-CM | POA: Diagnosis not present

## 2019-08-11 ENCOUNTER — Encounter (INDEPENDENT_AMBULATORY_CARE_PROVIDER_SITE_OTHER): Payer: Medicare Other | Admitting: Ophthalmology

## 2019-08-11 DIAGNOSIS — E11311 Type 2 diabetes mellitus with unspecified diabetic retinopathy with macular edema: Secondary | ICD-10-CM

## 2019-08-11 DIAGNOSIS — H43813 Vitreous degeneration, bilateral: Secondary | ICD-10-CM

## 2019-08-11 DIAGNOSIS — E113513 Type 2 diabetes mellitus with proliferative diabetic retinopathy with macular edema, bilateral: Secondary | ICD-10-CM

## 2019-08-11 DIAGNOSIS — H35033 Hypertensive retinopathy, bilateral: Secondary | ICD-10-CM

## 2019-08-11 DIAGNOSIS — I10 Essential (primary) hypertension: Secondary | ICD-10-CM | POA: Diagnosis not present

## 2019-08-23 DIAGNOSIS — Z9181 History of falling: Secondary | ICD-10-CM | POA: Diagnosis not present

## 2019-08-23 DIAGNOSIS — Z1331 Encounter for screening for depression: Secondary | ICD-10-CM | POA: Diagnosis not present

## 2019-08-23 DIAGNOSIS — R600 Localized edema: Secondary | ICD-10-CM | POA: Diagnosis not present

## 2019-08-23 DIAGNOSIS — Z139 Encounter for screening, unspecified: Secondary | ICD-10-CM | POA: Diagnosis not present

## 2019-08-23 DIAGNOSIS — N184 Chronic kidney disease, stage 4 (severe): Secondary | ICD-10-CM | POA: Diagnosis not present

## 2019-08-23 DIAGNOSIS — E1122 Type 2 diabetes mellitus with diabetic chronic kidney disease: Secondary | ICD-10-CM | POA: Diagnosis not present

## 2019-08-23 DIAGNOSIS — I10 Essential (primary) hypertension: Secondary | ICD-10-CM | POA: Diagnosis not present

## 2019-09-06 DIAGNOSIS — D631 Anemia in chronic kidney disease: Secondary | ICD-10-CM | POA: Diagnosis not present

## 2019-09-06 DIAGNOSIS — N185 Chronic kidney disease, stage 5: Secondary | ICD-10-CM | POA: Diagnosis not present

## 2019-09-06 DIAGNOSIS — N184 Chronic kidney disease, stage 4 (severe): Secondary | ICD-10-CM | POA: Diagnosis not present

## 2019-09-09 DIAGNOSIS — I120 Hypertensive chronic kidney disease with stage 5 chronic kidney disease or end stage renal disease: Secondary | ICD-10-CM | POA: Diagnosis not present

## 2019-09-09 DIAGNOSIS — D631 Anemia in chronic kidney disease: Secondary | ICD-10-CM | POA: Diagnosis not present

## 2019-09-09 DIAGNOSIS — E1122 Type 2 diabetes mellitus with diabetic chronic kidney disease: Secondary | ICD-10-CM | POA: Diagnosis not present

## 2019-09-09 DIAGNOSIS — N185 Chronic kidney disease, stage 5: Secondary | ICD-10-CM | POA: Diagnosis not present

## 2019-09-22 ENCOUNTER — Encounter (INDEPENDENT_AMBULATORY_CARE_PROVIDER_SITE_OTHER): Payer: Medicare Other | Admitting: Ophthalmology

## 2019-09-22 ENCOUNTER — Other Ambulatory Visit: Payer: Self-pay

## 2019-09-22 DIAGNOSIS — I10 Essential (primary) hypertension: Secondary | ICD-10-CM | POA: Diagnosis not present

## 2019-09-22 DIAGNOSIS — E11311 Type 2 diabetes mellitus with unspecified diabetic retinopathy with macular edema: Secondary | ICD-10-CM | POA: Diagnosis not present

## 2019-09-22 DIAGNOSIS — H43813 Vitreous degeneration, bilateral: Secondary | ICD-10-CM

## 2019-09-22 DIAGNOSIS — H35033 Hypertensive retinopathy, bilateral: Secondary | ICD-10-CM

## 2019-09-22 DIAGNOSIS — E113513 Type 2 diabetes mellitus with proliferative diabetic retinopathy with macular edema, bilateral: Secondary | ICD-10-CM | POA: Diagnosis not present

## 2019-11-03 ENCOUNTER — Encounter (INDEPENDENT_AMBULATORY_CARE_PROVIDER_SITE_OTHER): Payer: Medicare Other | Admitting: Ophthalmology

## 2019-11-03 ENCOUNTER — Other Ambulatory Visit: Payer: Self-pay

## 2019-11-03 DIAGNOSIS — E11311 Type 2 diabetes mellitus with unspecified diabetic retinopathy with macular edema: Secondary | ICD-10-CM | POA: Diagnosis not present

## 2019-11-03 DIAGNOSIS — E113511 Type 2 diabetes mellitus with proliferative diabetic retinopathy with macular edema, right eye: Secondary | ICD-10-CM | POA: Diagnosis not present

## 2019-11-03 DIAGNOSIS — H35033 Hypertensive retinopathy, bilateral: Secondary | ICD-10-CM

## 2019-11-03 DIAGNOSIS — I10 Essential (primary) hypertension: Secondary | ICD-10-CM

## 2019-11-03 DIAGNOSIS — H43813 Vitreous degeneration, bilateral: Secondary | ICD-10-CM

## 2019-11-03 DIAGNOSIS — E113592 Type 2 diabetes mellitus with proliferative diabetic retinopathy without macular edema, left eye: Secondary | ICD-10-CM

## 2019-11-22 DIAGNOSIS — N2581 Secondary hyperparathyroidism of renal origin: Secondary | ICD-10-CM | POA: Diagnosis not present

## 2019-11-22 DIAGNOSIS — D631 Anemia in chronic kidney disease: Secondary | ICD-10-CM | POA: Diagnosis not present

## 2019-11-22 DIAGNOSIS — N185 Chronic kidney disease, stage 5: Secondary | ICD-10-CM | POA: Diagnosis not present

## 2019-11-24 DIAGNOSIS — M79605 Pain in left leg: Secondary | ICD-10-CM | POA: Diagnosis not present

## 2019-11-24 DIAGNOSIS — H9202 Otalgia, left ear: Secondary | ICD-10-CM | POA: Diagnosis not present

## 2019-11-24 DIAGNOSIS — D631 Anemia in chronic kidney disease: Secondary | ICD-10-CM | POA: Diagnosis not present

## 2019-11-24 DIAGNOSIS — E1122 Type 2 diabetes mellitus with diabetic chronic kidney disease: Secondary | ICD-10-CM | POA: Diagnosis not present

## 2019-11-24 DIAGNOSIS — Z79899 Other long term (current) drug therapy: Secondary | ICD-10-CM | POA: Diagnosis not present

## 2019-11-24 DIAGNOSIS — E785 Hyperlipidemia, unspecified: Secondary | ICD-10-CM | POA: Diagnosis not present

## 2019-11-24 DIAGNOSIS — Z6827 Body mass index (BMI) 27.0-27.9, adult: Secondary | ICD-10-CM | POA: Diagnosis not present

## 2019-11-24 DIAGNOSIS — N185 Chronic kidney disease, stage 5: Secondary | ICD-10-CM | POA: Diagnosis not present

## 2019-11-25 DIAGNOSIS — N185 Chronic kidney disease, stage 5: Secondary | ICD-10-CM | POA: Diagnosis not present

## 2019-11-25 DIAGNOSIS — D631 Anemia in chronic kidney disease: Secondary | ICD-10-CM | POA: Diagnosis not present

## 2019-11-25 DIAGNOSIS — Z7984 Long term (current) use of oral hypoglycemic drugs: Secondary | ICD-10-CM | POA: Diagnosis not present

## 2019-11-25 DIAGNOSIS — N2581 Secondary hyperparathyroidism of renal origin: Secondary | ICD-10-CM | POA: Diagnosis not present

## 2019-11-25 DIAGNOSIS — E1122 Type 2 diabetes mellitus with diabetic chronic kidney disease: Secondary | ICD-10-CM | POA: Diagnosis not present

## 2019-11-25 DIAGNOSIS — I120 Hypertensive chronic kidney disease with stage 5 chronic kidney disease or end stage renal disease: Secondary | ICD-10-CM | POA: Diagnosis not present

## 2020-01-05 DIAGNOSIS — L820 Inflamed seborrheic keratosis: Secondary | ICD-10-CM | POA: Diagnosis not present

## 2020-01-05 DIAGNOSIS — C4441 Basal cell carcinoma of skin of scalp and neck: Secondary | ICD-10-CM | POA: Diagnosis not present

## 2020-01-07 DIAGNOSIS — D631 Anemia in chronic kidney disease: Secondary | ICD-10-CM | POA: Diagnosis not present

## 2020-01-07 DIAGNOSIS — N185 Chronic kidney disease, stage 5: Secondary | ICD-10-CM | POA: Diagnosis not present

## 2020-01-12 ENCOUNTER — Encounter (INDEPENDENT_AMBULATORY_CARE_PROVIDER_SITE_OTHER): Payer: Medicare Other | Admitting: Ophthalmology

## 2020-01-12 ENCOUNTER — Other Ambulatory Visit: Payer: Self-pay

## 2020-01-12 DIAGNOSIS — H35033 Hypertensive retinopathy, bilateral: Secondary | ICD-10-CM | POA: Diagnosis not present

## 2020-01-12 DIAGNOSIS — H43813 Vitreous degeneration, bilateral: Secondary | ICD-10-CM

## 2020-01-12 DIAGNOSIS — E11311 Type 2 diabetes mellitus with unspecified diabetic retinopathy with macular edema: Secondary | ICD-10-CM | POA: Diagnosis not present

## 2020-01-12 DIAGNOSIS — I10 Essential (primary) hypertension: Secondary | ICD-10-CM

## 2020-01-12 DIAGNOSIS — E113513 Type 2 diabetes mellitus with proliferative diabetic retinopathy with macular edema, bilateral: Secondary | ICD-10-CM | POA: Diagnosis not present

## 2020-01-13 DIAGNOSIS — Z7984 Long term (current) use of oral hypoglycemic drugs: Secondary | ICD-10-CM | POA: Diagnosis not present

## 2020-01-13 DIAGNOSIS — I120 Hypertensive chronic kidney disease with stage 5 chronic kidney disease or end stage renal disease: Secondary | ICD-10-CM | POA: Diagnosis not present

## 2020-01-13 DIAGNOSIS — E1122 Type 2 diabetes mellitus with diabetic chronic kidney disease: Secondary | ICD-10-CM | POA: Diagnosis not present

## 2020-01-13 DIAGNOSIS — N185 Chronic kidney disease, stage 5: Secondary | ICD-10-CM | POA: Diagnosis not present

## 2020-01-13 DIAGNOSIS — D631 Anemia in chronic kidney disease: Secondary | ICD-10-CM | POA: Diagnosis not present

## 2020-01-13 DIAGNOSIS — E875 Hyperkalemia: Secondary | ICD-10-CM | POA: Diagnosis not present

## 2020-01-18 DIAGNOSIS — Z23 Encounter for immunization: Secondary | ICD-10-CM | POA: Diagnosis not present

## 2020-01-28 DIAGNOSIS — Z6826 Body mass index (BMI) 26.0-26.9, adult: Secondary | ICD-10-CM | POA: Diagnosis not present

## 2020-01-28 DIAGNOSIS — R300 Dysuria: Secondary | ICD-10-CM | POA: Diagnosis not present

## 2020-02-07 DIAGNOSIS — D631 Anemia in chronic kidney disease: Secondary | ICD-10-CM | POA: Diagnosis not present

## 2020-02-07 DIAGNOSIS — N185 Chronic kidney disease, stage 5: Secondary | ICD-10-CM | POA: Diagnosis not present

## 2020-02-09 DIAGNOSIS — X32XXXD Exposure to sunlight, subsequent encounter: Secondary | ICD-10-CM | POA: Diagnosis not present

## 2020-02-09 DIAGNOSIS — Z08 Encounter for follow-up examination after completed treatment for malignant neoplasm: Secondary | ICD-10-CM | POA: Diagnosis not present

## 2020-02-09 DIAGNOSIS — Z23 Encounter for immunization: Secondary | ICD-10-CM | POA: Diagnosis not present

## 2020-02-09 DIAGNOSIS — Z85828 Personal history of other malignant neoplasm of skin: Secondary | ICD-10-CM | POA: Diagnosis not present

## 2020-02-09 DIAGNOSIS — L570 Actinic keratosis: Secondary | ICD-10-CM | POA: Diagnosis not present

## 2020-02-14 DIAGNOSIS — N185 Chronic kidney disease, stage 5: Secondary | ICD-10-CM | POA: Diagnosis not present

## 2020-02-14 DIAGNOSIS — D631 Anemia in chronic kidney disease: Secondary | ICD-10-CM | POA: Diagnosis not present

## 2020-03-06 DIAGNOSIS — N185 Chronic kidney disease, stage 5: Secondary | ICD-10-CM | POA: Diagnosis not present

## 2020-03-06 DIAGNOSIS — D631 Anemia in chronic kidney disease: Secondary | ICD-10-CM | POA: Diagnosis not present

## 2020-03-14 DIAGNOSIS — N185 Chronic kidney disease, stage 5: Secondary | ICD-10-CM | POA: Diagnosis not present

## 2020-03-14 DIAGNOSIS — I120 Hypertensive chronic kidney disease with stage 5 chronic kidney disease or end stage renal disease: Secondary | ICD-10-CM | POA: Diagnosis not present

## 2020-03-14 DIAGNOSIS — E1122 Type 2 diabetes mellitus with diabetic chronic kidney disease: Secondary | ICD-10-CM | POA: Diagnosis not present

## 2020-03-14 DIAGNOSIS — D631 Anemia in chronic kidney disease: Secondary | ICD-10-CM | POA: Diagnosis not present

## 2020-03-16 DIAGNOSIS — D631 Anemia in chronic kidney disease: Secondary | ICD-10-CM | POA: Diagnosis not present

## 2020-03-16 DIAGNOSIS — N185 Chronic kidney disease, stage 5: Secondary | ICD-10-CM | POA: Diagnosis not present

## 2020-03-22 ENCOUNTER — Other Ambulatory Visit: Payer: Self-pay

## 2020-03-22 ENCOUNTER — Encounter (INDEPENDENT_AMBULATORY_CARE_PROVIDER_SITE_OTHER): Payer: Medicare Other | Admitting: Ophthalmology

## 2020-03-22 DIAGNOSIS — H35033 Hypertensive retinopathy, bilateral: Secondary | ICD-10-CM | POA: Diagnosis not present

## 2020-03-22 DIAGNOSIS — E113513 Type 2 diabetes mellitus with proliferative diabetic retinopathy with macular edema, bilateral: Secondary | ICD-10-CM | POA: Diagnosis not present

## 2020-03-22 DIAGNOSIS — E11311 Type 2 diabetes mellitus with unspecified diabetic retinopathy with macular edema: Secondary | ICD-10-CM

## 2020-03-22 DIAGNOSIS — I10 Essential (primary) hypertension: Secondary | ICD-10-CM | POA: Diagnosis not present

## 2020-03-22 DIAGNOSIS — H43813 Vitreous degeneration, bilateral: Secondary | ICD-10-CM

## 2020-03-29 DIAGNOSIS — H40051 Ocular hypertension, right eye: Secondary | ICD-10-CM | POA: Diagnosis not present

## 2020-04-03 DIAGNOSIS — N185 Chronic kidney disease, stage 5: Secondary | ICD-10-CM | POA: Diagnosis not present

## 2020-04-03 DIAGNOSIS — N2581 Secondary hyperparathyroidism of renal origin: Secondary | ICD-10-CM | POA: Diagnosis not present

## 2020-04-03 DIAGNOSIS — E785 Hyperlipidemia, unspecified: Secondary | ICD-10-CM | POA: Diagnosis not present

## 2020-04-03 DIAGNOSIS — Z79899 Other long term (current) drug therapy: Secondary | ICD-10-CM | POA: Diagnosis not present

## 2020-04-03 DIAGNOSIS — D631 Anemia in chronic kidney disease: Secondary | ICD-10-CM | POA: Diagnosis not present

## 2020-04-03 DIAGNOSIS — Z6826 Body mass index (BMI) 26.0-26.9, adult: Secondary | ICD-10-CM | POA: Diagnosis not present

## 2020-04-03 DIAGNOSIS — E113593 Type 2 diabetes mellitus with proliferative diabetic retinopathy without macular edema, bilateral: Secondary | ICD-10-CM | POA: Diagnosis not present

## 2020-04-04 DIAGNOSIS — H40051 Ocular hypertension, right eye: Secondary | ICD-10-CM | POA: Diagnosis not present

## 2020-05-11 DIAGNOSIS — D631 Anemia in chronic kidney disease: Secondary | ICD-10-CM | POA: Diagnosis not present

## 2020-05-11 DIAGNOSIS — N185 Chronic kidney disease, stage 5: Secondary | ICD-10-CM | POA: Diagnosis not present

## 2020-05-17 DIAGNOSIS — E875 Hyperkalemia: Secondary | ICD-10-CM | POA: Diagnosis not present

## 2020-05-17 DIAGNOSIS — N2581 Secondary hyperparathyroidism of renal origin: Secondary | ICD-10-CM | POA: Diagnosis not present

## 2020-05-17 DIAGNOSIS — I120 Hypertensive chronic kidney disease with stage 5 chronic kidney disease or end stage renal disease: Secondary | ICD-10-CM | POA: Diagnosis not present

## 2020-05-17 DIAGNOSIS — N185 Chronic kidney disease, stage 5: Secondary | ICD-10-CM | POA: Diagnosis not present

## 2020-05-17 DIAGNOSIS — D631 Anemia in chronic kidney disease: Secondary | ICD-10-CM | POA: Diagnosis not present

## 2020-05-17 DIAGNOSIS — E1122 Type 2 diabetes mellitus with diabetic chronic kidney disease: Secondary | ICD-10-CM | POA: Diagnosis not present

## 2020-05-31 ENCOUNTER — Encounter (INDEPENDENT_AMBULATORY_CARE_PROVIDER_SITE_OTHER): Payer: Medicare Other | Admitting: Ophthalmology

## 2020-05-31 ENCOUNTER — Other Ambulatory Visit: Payer: Self-pay

## 2020-05-31 DIAGNOSIS — E113513 Type 2 diabetes mellitus with proliferative diabetic retinopathy with macular edema, bilateral: Secondary | ICD-10-CM | POA: Diagnosis not present

## 2020-05-31 DIAGNOSIS — I10 Essential (primary) hypertension: Secondary | ICD-10-CM | POA: Diagnosis not present

## 2020-05-31 DIAGNOSIS — H35033 Hypertensive retinopathy, bilateral: Secondary | ICD-10-CM | POA: Diagnosis not present

## 2020-05-31 DIAGNOSIS — H43813 Vitreous degeneration, bilateral: Secondary | ICD-10-CM | POA: Diagnosis not present

## 2020-06-08 DIAGNOSIS — D631 Anemia in chronic kidney disease: Secondary | ICD-10-CM | POA: Diagnosis not present

## 2020-06-08 DIAGNOSIS — N185 Chronic kidney disease, stage 5: Secondary | ICD-10-CM | POA: Diagnosis not present

## 2020-06-14 DIAGNOSIS — N185 Chronic kidney disease, stage 5: Secondary | ICD-10-CM | POA: Diagnosis not present

## 2020-06-14 DIAGNOSIS — D631 Anemia in chronic kidney disease: Secondary | ICD-10-CM | POA: Diagnosis not present

## 2020-07-13 DIAGNOSIS — N2581 Secondary hyperparathyroidism of renal origin: Secondary | ICD-10-CM | POA: Diagnosis not present

## 2020-07-13 DIAGNOSIS — D631 Anemia in chronic kidney disease: Secondary | ICD-10-CM | POA: Diagnosis not present

## 2020-07-13 DIAGNOSIS — N185 Chronic kidney disease, stage 5: Secondary | ICD-10-CM | POA: Diagnosis not present

## 2020-07-19 DIAGNOSIS — D631 Anemia in chronic kidney disease: Secondary | ICD-10-CM | POA: Diagnosis not present

## 2020-07-19 DIAGNOSIS — N2581 Secondary hyperparathyroidism of renal origin: Secondary | ICD-10-CM | POA: Diagnosis not present

## 2020-07-19 DIAGNOSIS — E1122 Type 2 diabetes mellitus with diabetic chronic kidney disease: Secondary | ICD-10-CM | POA: Diagnosis not present

## 2020-07-19 DIAGNOSIS — R809 Proteinuria, unspecified: Secondary | ICD-10-CM | POA: Diagnosis not present

## 2020-07-19 DIAGNOSIS — I120 Hypertensive chronic kidney disease with stage 5 chronic kidney disease or end stage renal disease: Secondary | ICD-10-CM | POA: Diagnosis not present

## 2020-07-19 DIAGNOSIS — N185 Chronic kidney disease, stage 5: Secondary | ICD-10-CM | POA: Diagnosis not present

## 2020-08-03 DIAGNOSIS — E785 Hyperlipidemia, unspecified: Secondary | ICD-10-CM | POA: Diagnosis not present

## 2020-08-03 DIAGNOSIS — E113593 Type 2 diabetes mellitus with proliferative diabetic retinopathy without macular edema, bilateral: Secondary | ICD-10-CM | POA: Diagnosis not present

## 2020-08-03 DIAGNOSIS — Z79899 Other long term (current) drug therapy: Secondary | ICD-10-CM | POA: Diagnosis not present

## 2020-08-09 DIAGNOSIS — B078 Other viral warts: Secondary | ICD-10-CM | POA: Diagnosis not present

## 2020-08-09 DIAGNOSIS — X32XXXD Exposure to sunlight, subsequent encounter: Secondary | ICD-10-CM | POA: Diagnosis not present

## 2020-08-09 DIAGNOSIS — L570 Actinic keratosis: Secondary | ICD-10-CM | POA: Diagnosis not present

## 2020-08-16 DIAGNOSIS — N2581 Secondary hyperparathyroidism of renal origin: Secondary | ICD-10-CM | POA: Diagnosis not present

## 2020-08-16 DIAGNOSIS — Z6826 Body mass index (BMI) 26.0-26.9, adult: Secondary | ICD-10-CM | POA: Diagnosis not present

## 2020-08-16 DIAGNOSIS — N185 Chronic kidney disease, stage 5: Secondary | ICD-10-CM | POA: Diagnosis not present

## 2020-08-16 DIAGNOSIS — I10 Essential (primary) hypertension: Secondary | ICD-10-CM | POA: Diagnosis not present

## 2020-08-16 DIAGNOSIS — E113593 Type 2 diabetes mellitus with proliferative diabetic retinopathy without macular edema, bilateral: Secondary | ICD-10-CM | POA: Diagnosis not present

## 2020-08-16 DIAGNOSIS — E785 Hyperlipidemia, unspecified: Secondary | ICD-10-CM | POA: Diagnosis not present

## 2020-08-30 ENCOUNTER — Encounter (INDEPENDENT_AMBULATORY_CARE_PROVIDER_SITE_OTHER): Payer: Medicare Other | Admitting: Ophthalmology

## 2020-08-30 ENCOUNTER — Other Ambulatory Visit: Payer: Self-pay

## 2020-08-30 DIAGNOSIS — H43813 Vitreous degeneration, bilateral: Secondary | ICD-10-CM | POA: Diagnosis not present

## 2020-08-30 DIAGNOSIS — I10 Essential (primary) hypertension: Secondary | ICD-10-CM | POA: Diagnosis not present

## 2020-08-30 DIAGNOSIS — H35033 Hypertensive retinopathy, bilateral: Secondary | ICD-10-CM

## 2020-08-30 DIAGNOSIS — E113513 Type 2 diabetes mellitus with proliferative diabetic retinopathy with macular edema, bilateral: Secondary | ICD-10-CM | POA: Diagnosis not present

## 2020-09-06 DIAGNOSIS — N185 Chronic kidney disease, stage 5: Secondary | ICD-10-CM | POA: Diagnosis not present

## 2020-09-06 DIAGNOSIS — D631 Anemia in chronic kidney disease: Secondary | ICD-10-CM | POA: Diagnosis not present

## 2020-09-13 DIAGNOSIS — N2581 Secondary hyperparathyroidism of renal origin: Secondary | ICD-10-CM | POA: Diagnosis not present

## 2020-09-13 DIAGNOSIS — N185 Chronic kidney disease, stage 5: Secondary | ICD-10-CM | POA: Diagnosis not present

## 2020-09-13 DIAGNOSIS — D631 Anemia in chronic kidney disease: Secondary | ICD-10-CM | POA: Diagnosis not present

## 2020-09-13 DIAGNOSIS — E1122 Type 2 diabetes mellitus with diabetic chronic kidney disease: Secondary | ICD-10-CM | POA: Diagnosis not present

## 2020-09-13 DIAGNOSIS — I120 Hypertensive chronic kidney disease with stage 5 chronic kidney disease or end stage renal disease: Secondary | ICD-10-CM | POA: Diagnosis not present

## 2020-09-27 DIAGNOSIS — H40051 Ocular hypertension, right eye: Secondary | ICD-10-CM | POA: Diagnosis not present

## 2020-10-25 DIAGNOSIS — H40051 Ocular hypertension, right eye: Secondary | ICD-10-CM | POA: Diagnosis not present

## 2020-11-03 ENCOUNTER — Encounter (INDEPENDENT_AMBULATORY_CARE_PROVIDER_SITE_OTHER): Payer: Medicare Other | Admitting: Ophthalmology

## 2020-11-03 ENCOUNTER — Other Ambulatory Visit: Payer: Self-pay

## 2020-11-03 DIAGNOSIS — H43813 Vitreous degeneration, bilateral: Secondary | ICD-10-CM

## 2020-11-03 DIAGNOSIS — E113513 Type 2 diabetes mellitus with proliferative diabetic retinopathy with macular edema, bilateral: Secondary | ICD-10-CM | POA: Diagnosis not present

## 2020-11-03 DIAGNOSIS — H35033 Hypertensive retinopathy, bilateral: Secondary | ICD-10-CM | POA: Diagnosis not present

## 2020-11-03 DIAGNOSIS — I10 Essential (primary) hypertension: Secondary | ICD-10-CM

## 2020-11-09 DIAGNOSIS — D631 Anemia in chronic kidney disease: Secondary | ICD-10-CM | POA: Diagnosis not present

## 2020-11-09 DIAGNOSIS — N185 Chronic kidney disease, stage 5: Secondary | ICD-10-CM | POA: Diagnosis not present

## 2020-11-14 DIAGNOSIS — E113593 Type 2 diabetes mellitus with proliferative diabetic retinopathy without macular edema, bilateral: Secondary | ICD-10-CM | POA: Diagnosis not present

## 2020-11-14 DIAGNOSIS — Z79899 Other long term (current) drug therapy: Secondary | ICD-10-CM | POA: Diagnosis not present

## 2020-11-14 DIAGNOSIS — E785 Hyperlipidemia, unspecified: Secondary | ICD-10-CM | POA: Diagnosis not present

## 2020-11-15 DIAGNOSIS — I120 Hypertensive chronic kidney disease with stage 5 chronic kidney disease or end stage renal disease: Secondary | ICD-10-CM | POA: Diagnosis not present

## 2020-11-15 DIAGNOSIS — N2581 Secondary hyperparathyroidism of renal origin: Secondary | ICD-10-CM | POA: Diagnosis not present

## 2020-11-15 DIAGNOSIS — Z7984 Long term (current) use of oral hypoglycemic drugs: Secondary | ICD-10-CM | POA: Diagnosis not present

## 2020-11-15 DIAGNOSIS — E1122 Type 2 diabetes mellitus with diabetic chronic kidney disease: Secondary | ICD-10-CM | POA: Diagnosis not present

## 2020-11-15 DIAGNOSIS — N185 Chronic kidney disease, stage 5: Secondary | ICD-10-CM | POA: Diagnosis not present

## 2020-11-15 DIAGNOSIS — D631 Anemia in chronic kidney disease: Secondary | ICD-10-CM | POA: Diagnosis not present

## 2020-11-23 DIAGNOSIS — Z6826 Body mass index (BMI) 26.0-26.9, adult: Secondary | ICD-10-CM | POA: Diagnosis not present

## 2020-11-23 DIAGNOSIS — E785 Hyperlipidemia, unspecified: Secondary | ICD-10-CM | POA: Diagnosis not present

## 2020-11-23 DIAGNOSIS — N185 Chronic kidney disease, stage 5: Secondary | ICD-10-CM | POA: Diagnosis not present

## 2020-11-23 DIAGNOSIS — E1122 Type 2 diabetes mellitus with diabetic chronic kidney disease: Secondary | ICD-10-CM | POA: Diagnosis not present

## 2020-11-27 DIAGNOSIS — H40051 Ocular hypertension, right eye: Secondary | ICD-10-CM | POA: Diagnosis not present

## 2020-12-14 DIAGNOSIS — D631 Anemia in chronic kidney disease: Secondary | ICD-10-CM | POA: Diagnosis not present

## 2020-12-14 DIAGNOSIS — N2581 Secondary hyperparathyroidism of renal origin: Secondary | ICD-10-CM | POA: Diagnosis not present

## 2020-12-14 DIAGNOSIS — N185 Chronic kidney disease, stage 5: Secondary | ICD-10-CM | POA: Diagnosis not present

## 2020-12-19 DIAGNOSIS — N185 Chronic kidney disease, stage 5: Secondary | ICD-10-CM | POA: Diagnosis not present

## 2020-12-19 DIAGNOSIS — Z7984 Long term (current) use of oral hypoglycemic drugs: Secondary | ICD-10-CM | POA: Diagnosis not present

## 2020-12-19 DIAGNOSIS — E1122 Type 2 diabetes mellitus with diabetic chronic kidney disease: Secondary | ICD-10-CM | POA: Diagnosis not present

## 2020-12-19 DIAGNOSIS — D631 Anemia in chronic kidney disease: Secondary | ICD-10-CM | POA: Diagnosis not present

## 2020-12-19 DIAGNOSIS — N2581 Secondary hyperparathyroidism of renal origin: Secondary | ICD-10-CM | POA: Diagnosis not present

## 2020-12-19 DIAGNOSIS — I120 Hypertensive chronic kidney disease with stage 5 chronic kidney disease or end stage renal disease: Secondary | ICD-10-CM | POA: Diagnosis not present

## 2020-12-22 DIAGNOSIS — N185 Chronic kidney disease, stage 5: Secondary | ICD-10-CM | POA: Diagnosis not present

## 2020-12-22 DIAGNOSIS — D631 Anemia in chronic kidney disease: Secondary | ICD-10-CM | POA: Diagnosis not present

## 2021-01-11 DIAGNOSIS — N185 Chronic kidney disease, stage 5: Secondary | ICD-10-CM | POA: Diagnosis not present

## 2021-01-11 DIAGNOSIS — D631 Anemia in chronic kidney disease: Secondary | ICD-10-CM | POA: Diagnosis not present

## 2021-01-12 DIAGNOSIS — L570 Actinic keratosis: Secondary | ICD-10-CM | POA: Diagnosis not present

## 2021-01-12 DIAGNOSIS — X32XXXD Exposure to sunlight, subsequent encounter: Secondary | ICD-10-CM | POA: Diagnosis not present

## 2021-01-12 DIAGNOSIS — B078 Other viral warts: Secondary | ICD-10-CM | POA: Diagnosis not present

## 2021-01-12 DIAGNOSIS — L820 Inflamed seborrheic keratosis: Secondary | ICD-10-CM | POA: Diagnosis not present

## 2021-01-18 DIAGNOSIS — I120 Hypertensive chronic kidney disease with stage 5 chronic kidney disease or end stage renal disease: Secondary | ICD-10-CM | POA: Diagnosis not present

## 2021-01-18 DIAGNOSIS — E11319 Type 2 diabetes mellitus with unspecified diabetic retinopathy without macular edema: Secondary | ICD-10-CM | POA: Diagnosis not present

## 2021-01-18 DIAGNOSIS — E1122 Type 2 diabetes mellitus with diabetic chronic kidney disease: Secondary | ICD-10-CM | POA: Diagnosis not present

## 2021-01-18 DIAGNOSIS — E875 Hyperkalemia: Secondary | ICD-10-CM | POA: Diagnosis not present

## 2021-01-18 DIAGNOSIS — D631 Anemia in chronic kidney disease: Secondary | ICD-10-CM | POA: Diagnosis not present

## 2021-01-18 DIAGNOSIS — Z7984 Long term (current) use of oral hypoglycemic drugs: Secondary | ICD-10-CM | POA: Diagnosis not present

## 2021-01-18 DIAGNOSIS — N185 Chronic kidney disease, stage 5: Secondary | ICD-10-CM | POA: Diagnosis not present

## 2021-01-19 ENCOUNTER — Encounter (INDEPENDENT_AMBULATORY_CARE_PROVIDER_SITE_OTHER): Payer: Medicare Other | Admitting: Ophthalmology

## 2021-01-19 ENCOUNTER — Other Ambulatory Visit: Payer: Self-pay

## 2021-01-19 DIAGNOSIS — I10 Essential (primary) hypertension: Secondary | ICD-10-CM

## 2021-01-19 DIAGNOSIS — H43813 Vitreous degeneration, bilateral: Secondary | ICD-10-CM | POA: Diagnosis not present

## 2021-01-19 DIAGNOSIS — H35033 Hypertensive retinopathy, bilateral: Secondary | ICD-10-CM | POA: Diagnosis not present

## 2021-01-19 DIAGNOSIS — E113513 Type 2 diabetes mellitus with proliferative diabetic retinopathy with macular edema, bilateral: Secondary | ICD-10-CM

## 2021-01-28 DIAGNOSIS — Z23 Encounter for immunization: Secondary | ICD-10-CM | POA: Diagnosis not present

## 2021-02-03 ENCOUNTER — Ambulatory Visit
Admission: EM | Admit: 2021-02-03 | Discharge: 2021-02-03 | Disposition: A | Discharge status: Home / Self Care | Payer: Medicare Other

## 2021-02-03 ENCOUNTER — Other Ambulatory Visit: Payer: Self-pay

## 2021-02-03 ENCOUNTER — Encounter: Payer: Self-pay | Admitting: Emergency Medicine

## 2021-02-03 DIAGNOSIS — R490 Dysphonia: Secondary | ICD-10-CM

## 2021-02-03 NOTE — ED Provider Notes (Signed)
[facility]    CSN: [id number] Arrival date & time: 02/03/21  3016      History   Chief Complaint Chief Complaint  Patient presents with   Fever   Hoarse    HPI Rose Cruz is a 87 y.o. female.   Patient presents with concerns of fever and hoarseness.  Patient reports that she received her flu and COVID-vaccine approximately 6 days ago.  She then started having a sore throat that was present approximately 5 days ago but resolved in the same day.  Was outside doing yard work prior to sore throat starting and attributes her sore throat to this.  Reports that she gargled with hydrogen peroxide water mixture that resolved the sore throat.  Patient now has persistent hoarseness.  Denies any upper respiratory symptoms, sore throat, ear pain.  Patient reports that her "fever" at home was 98.8.  She has been taking cough and cold medications over-the-counter with improvement.  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  Denies cough, chest pain, shortness of breath.   Fever  Past Medical History:  Diagnosis Date   Diabetes mellitus without complication ([place])    Hypertension    Stroke [facility])     Patient Active Problem List   Diagnosis Date Noted   Diabetes ([place]) 07/12/2015   Varicose veins 01/24/2012   Hyperlipidemia 01/24/2012   Anemia-chronic normocytic 01/24/2012   Colonic polyp-colonoscopy 2004 01/24/2012   Chronic back pain 01/24/2012   Microalbuminuria-2008 01/24/2012   B12 deficiency-2009 01/24/2012   Full dentures-age 12 01/24/2012   TIA (transient ischemic attack)-01/13/10 01/24/2012   BMI 28.0-28.9,adult 01/22/2012   HTN (hypertension) 01/01/2012   Diabetes mellitus with complication ([place]) 04/23/3233    Past Surgical History:  Procedure Laterality Date   COLON SURGERY     polypectomy 1004   EYE SURGERY     cataract   FRACTURE SURGERY     SPINE SURGERY     Dr Carloyn Manner  2006   TUBAL LIGATION      OB History   No obstetric history on file.      Home Medications    Prior to Admission medications   Medication Sig Start Date End Date Taking? Authorizing Provider  atorvastatin (LIPITOR) 40 MG tablet 1 tablet 10/22/16  Yes [provider]  brimonidine (ALPHAGAN) 0.2 % ophthalmic solution Place 1 drop into the right eye 2 times daily. 10/25/20  Yes [provider]  cloNIDine (CATAPRES) 0.1 MG tablet Take by mouth. 10/24/16  Yes [provider]  hydrALAZINE (APRESOLINE) 25 MG tablet Take by mouth. 12/19/20 12/19/21 Yes [provider]  amLODipine (NORVASC) 10 MG tablet Take 10 mg by mouth daily. 12/30/20   [provider]  amLODipine (NORVASC) 5 MG tablet Take 1 tablet (5 mg total) by mouth daily. Patient taking differently: Take 10 mg by mouth daily.  01/25/17   McVey, Gelene Mink, PA-C  aspirin 81 MG EC tablet Take by mouth.    [provider]  aspirin 81 MG tablet Take 81 mg by mouth daily.    [provider]  atorvastatin (LIPITOR) 40 MG tablet Take 40 mg by mouth daily.    [provider]  calcium acetate (PHOSLO) 667 MG capsule  11/27/20   [provider]  cloNIDine (CATAPRES) 0.1 MG tablet Take 0.1 mg by mouth 2 (two) times daily.    [provider]  dorzolamide-timolol (COSOPT) 22.3-6.8 MG/ML ophthalmic solution  11/27/20   [provider]  ferrous sulfate 324 MG TBEC Take by mouth.    [provider]  Ferrous Sulfate Dried 200 (65 FE) MG TABS Take by mouth.    [provider]  glimepiride (AMARYL) 2 MG tablet Take 2 mg by mouth daily with breakfast.    [provider]  hydrALAZINE (APRESOLINE) 25 MG tablet Take 25 mg by mouth 3 (three) times daily.    [provider]  losartan (COZAAR) 100 MG tablet Take 100 mg by mouth daily. 12/17/20   [provider]  losartan-hydrochlorothiazide (HYZAAR) 100-25 MG tablet Take  1 tablet by mouth daily. 05/31/16   Harrison Mons, PA  Multiple Vitamin (MULTIVITAMIN) tablet Take 1 tablet by mouth daily.    [provider]  MULTIPLE VITAMIN PO 1 tablet    [provider]    Family History Family History  Problem Relation Age of Onset   Cancer Mother    Cancer Father    Cancer Sister    Cancer Brother     Social History Social History   Tobacco Use   Smoking status: Never   Smokeless tobacco: Never  Substance Use Topics   Alcohol use: No   Drug use: No     Allergies   Penicillins and Statins   Review of Systems Review of Systems Per HPI  Physical Exam Triage Vital Signs ED Triage Vitals  Enc Vitals Group     BP 02/03/21 1013 126/62     Pulse Rate 02/03/21 1013 78     Resp 02/03/21 1013 16     Temp 02/03/21 1013 98 F (36.7 C)     Temp Source 02/03/21 1013 Oral     SpO2 02/03/21 1013 96 %     Weight --      Height --      Head Circumference --      Peak Flow --      Pain Score 02/03/21 1014 0     Pain Loc --      Pain Edu? --      Excl. in [place]? --    No data found.  Updated Vital Signs BP 126/62 (BP Location: Left Arm)   Pulse 78   Temp 98 F (36.7 C) (Oral)   Resp 16   SpO2 96%   Visual Acuity Right Eye Distance:   Left Eye Distance:   Bilateral Distance:    Right Eye Near:   Left Eye Near:    Bilateral Near:     Physical Exam Constitutional:      General: She is not in acute distress.    Appearance: Normal appearance. She is not toxic-appearing or diaphoretic.  HENT:     Head: Normocephalic and atraumatic.     Right Ear: Tympanic membrane and ear canal normal.     Left Ear: Tympanic membrane and ear canal normal.     Nose: Nose normal.     Mouth/Throat:     Mouth: Mucous membranes are moist.     Pharynx: No posterior oropharyngeal erythema.  Eyes:     Extraocular Movements: Extraocular movements intact.     Conjunctiva/sclera: Conjunctivae normal.     Pupils: Pupils are equal, round, and  reactive to light.  Cardiovascular:     Rate and Rhythm: Normal rate and regular rhythm.     Pulses: Normal pulses.     Heart sounds: Normal heart sounds.  Pulmonary:  Effort: Pulmonary effort is normal. No respiratory distress.     Breath sounds: Normal breath sounds.  Abdominal:     General: Abdomen is flat. Bowel sounds are normal. There is no distension.     Palpations: Abdomen is soft.     Tenderness: There is no abdominal tenderness.  Skin:    General: Skin is warm and dry.  Neurological:     General: No focal deficit present.     Mental Status: She is alert and oriented to person, place, and time. Mental status is at baseline.  Psychiatric:        Mood and Affect: Mood normal.        Behavior: Behavior normal.        Thought Content: Thought content normal.        Judgment: Judgment normal.     UC Treatments / Results  Labs (all labs ordered are listed, but only abnormal results are displayed) Labs Reviewed - No data to display  EKG   Radiology No results found.  Procedures Procedures (including critical care time)  Medications Ordered in UC Medications - No data to display  Initial Impression / Assessment and Plan / UC Course  I have reviewed the triage vital signs and the nursing notes.  Pertinent labs & imaging results that were available during my care of the patient were reviewed by me and considered in my medical decision making (see chart for details).     Patient appears physically well.  Suspect that patient had allergic rhinitis contributing to hoarseness and sore throat or immune response to vaccine administration.  No concerns at this time. the patient was offered COVID-19 testing.  Patient declined COVID-19 testing.  Discussed fever monitoring and management with patient and what constitutes a fever.Discussed strict return precautions. Patient verbalized understanding and is agreeable with plan.  Final Clinical Impressions(s) / UC Diagnoses    Final diagnoses:  Hoarseness     Discharge Instructions      You do appear physically well.  Please follow-up with primary care or urgent care if symptoms worsen or persist.     ED Prescriptions   None    PDMP not reviewed this encounter.   Teodora Medici, [place] 02/03/21 1046

## 2021-02-03 NOTE — ED Triage Notes (Signed)
Flu and covid shot Sunday. Tuesday had a sore throat. Gargled with hydrogen peroxide water mixture. Throat no longer hurts. Now c/o new fever and hoarseness starting today. 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

## 2021-02-03 NOTE — Discharge Instructions (Signed)
You do appear physically well.  Please follow-up with primary care or urgent care if symptoms worsen or persist.

## 2021-02-15 DIAGNOSIS — D631 Anemia in chronic kidney disease: Secondary | ICD-10-CM | POA: Diagnosis not present

## 2021-02-15 DIAGNOSIS — N185 Chronic kidney disease, stage 5: Secondary | ICD-10-CM | POA: Diagnosis not present

## 2021-02-20 DIAGNOSIS — Z79899 Other long term (current) drug therapy: Secondary | ICD-10-CM | POA: Diagnosis not present

## 2021-02-20 DIAGNOSIS — E113593 Type 2 diabetes mellitus with proliferative diabetic retinopathy without macular edema, bilateral: Secondary | ICD-10-CM | POA: Diagnosis not present

## 2021-02-20 DIAGNOSIS — E785 Hyperlipidemia, unspecified: Secondary | ICD-10-CM | POA: Diagnosis not present

## 2021-02-21 DIAGNOSIS — Z7984 Long term (current) use of oral hypoglycemic drugs: Secondary | ICD-10-CM | POA: Diagnosis not present

## 2021-02-21 DIAGNOSIS — D631 Anemia in chronic kidney disease: Secondary | ICD-10-CM | POA: Diagnosis not present

## 2021-02-21 DIAGNOSIS — I120 Hypertensive chronic kidney disease with stage 5 chronic kidney disease or end stage renal disease: Secondary | ICD-10-CM | POA: Diagnosis not present

## 2021-02-21 DIAGNOSIS — E875 Hyperkalemia: Secondary | ICD-10-CM | POA: Diagnosis not present

## 2021-02-21 DIAGNOSIS — E1122 Type 2 diabetes mellitus with diabetic chronic kidney disease: Secondary | ICD-10-CM | POA: Diagnosis not present

## 2021-02-21 DIAGNOSIS — N185 Chronic kidney disease, stage 5: Secondary | ICD-10-CM | POA: Diagnosis not present

## 2021-02-21 DIAGNOSIS — E11319 Type 2 diabetes mellitus with unspecified diabetic retinopathy without macular edema: Secondary | ICD-10-CM | POA: Diagnosis not present

## 2021-02-23 DIAGNOSIS — Z9181 History of falling: Secondary | ICD-10-CM | POA: Diagnosis not present

## 2021-02-23 DIAGNOSIS — I10 Essential (primary) hypertension: Secondary | ICD-10-CM | POA: Diagnosis not present

## 2021-02-23 DIAGNOSIS — N185 Chronic kidney disease, stage 5: Secondary | ICD-10-CM | POA: Diagnosis not present

## 2021-02-23 DIAGNOSIS — E113593 Type 2 diabetes mellitus with proliferative diabetic retinopathy without macular edema, bilateral: Secondary | ICD-10-CM | POA: Diagnosis not present

## 2021-02-23 DIAGNOSIS — E785 Hyperlipidemia, unspecified: Secondary | ICD-10-CM | POA: Diagnosis not present

## 2021-02-23 DIAGNOSIS — Z1331 Encounter for screening for depression: Secondary | ICD-10-CM | POA: Diagnosis not present

## 2021-03-30 ENCOUNTER — Encounter (INDEPENDENT_AMBULATORY_CARE_PROVIDER_SITE_OTHER): Payer: Medicare Other | Admitting: Ophthalmology

## 2021-03-30 ENCOUNTER — Other Ambulatory Visit: Payer: Self-pay

## 2021-03-30 DIAGNOSIS — H35033 Hypertensive retinopathy, bilateral: Secondary | ICD-10-CM | POA: Diagnosis not present

## 2021-03-30 DIAGNOSIS — E103513 Type 1 diabetes mellitus with proliferative diabetic retinopathy with macular edema, bilateral: Secondary | ICD-10-CM | POA: Diagnosis not present

## 2021-03-30 DIAGNOSIS — I10 Essential (primary) hypertension: Secondary | ICD-10-CM | POA: Diagnosis not present

## 2021-03-30 DIAGNOSIS — H43813 Vitreous degeneration, bilateral: Secondary | ICD-10-CM

## 2021-04-02 DIAGNOSIS — N185 Chronic kidney disease, stage 5: Secondary | ICD-10-CM | POA: Diagnosis not present

## 2021-04-02 DIAGNOSIS — D631 Anemia in chronic kidney disease: Secondary | ICD-10-CM | POA: Diagnosis not present

## 2021-04-04 DIAGNOSIS — E872 Acidosis, unspecified: Secondary | ICD-10-CM | POA: Diagnosis not present

## 2021-04-04 DIAGNOSIS — N185 Chronic kidney disease, stage 5: Secondary | ICD-10-CM | POA: Diagnosis not present

## 2021-04-04 DIAGNOSIS — M899 Disorder of bone, unspecified: Secondary | ICD-10-CM | POA: Diagnosis not present

## 2021-04-04 DIAGNOSIS — E11319 Type 2 diabetes mellitus with unspecified diabetic retinopathy without macular edema: Secondary | ICD-10-CM | POA: Diagnosis not present

## 2021-04-04 DIAGNOSIS — I120 Hypertensive chronic kidney disease with stage 5 chronic kidney disease or end stage renal disease: Secondary | ICD-10-CM | POA: Diagnosis not present

## 2021-04-04 DIAGNOSIS — D631 Anemia in chronic kidney disease: Secondary | ICD-10-CM | POA: Diagnosis not present

## 2021-04-04 DIAGNOSIS — Z7984 Long term (current) use of oral hypoglycemic drugs: Secondary | ICD-10-CM | POA: Diagnosis not present

## 2021-04-04 DIAGNOSIS — E1122 Type 2 diabetes mellitus with diabetic chronic kidney disease: Secondary | ICD-10-CM | POA: Diagnosis not present

## 2021-05-10 DIAGNOSIS — N185 Chronic kidney disease, stage 5: Secondary | ICD-10-CM | POA: Diagnosis not present

## 2021-05-10 DIAGNOSIS — D631 Anemia in chronic kidney disease: Secondary | ICD-10-CM | POA: Diagnosis not present

## 2021-05-16 DIAGNOSIS — N185 Chronic kidney disease, stage 5: Secondary | ICD-10-CM | POA: Diagnosis not present

## 2021-05-16 DIAGNOSIS — I120 Hypertensive chronic kidney disease with stage 5 chronic kidney disease or end stage renal disease: Secondary | ICD-10-CM | POA: Diagnosis not present

## 2021-05-16 DIAGNOSIS — Z7984 Long term (current) use of oral hypoglycemic drugs: Secondary | ICD-10-CM | POA: Diagnosis not present

## 2021-05-16 DIAGNOSIS — D631 Anemia in chronic kidney disease: Secondary | ICD-10-CM | POA: Diagnosis not present

## 2021-05-16 DIAGNOSIS — E872 Acidosis, unspecified: Secondary | ICD-10-CM | POA: Diagnosis not present

## 2021-05-16 DIAGNOSIS — E1122 Type 2 diabetes mellitus with diabetic chronic kidney disease: Secondary | ICD-10-CM | POA: Diagnosis not present

## 2021-05-23 ENCOUNTER — Encounter (INDEPENDENT_AMBULATORY_CARE_PROVIDER_SITE_OTHER): Payer: Medicare Other | Admitting: Ophthalmology

## 2021-05-23 ENCOUNTER — Other Ambulatory Visit: Payer: Self-pay

## 2021-05-23 DIAGNOSIS — I10 Essential (primary) hypertension: Secondary | ICD-10-CM | POA: Diagnosis not present

## 2021-05-23 DIAGNOSIS — E103511 Type 1 diabetes mellitus with proliferative diabetic retinopathy with macular edema, right eye: Secondary | ICD-10-CM | POA: Diagnosis not present

## 2021-05-23 DIAGNOSIS — H43813 Vitreous degeneration, bilateral: Secondary | ICD-10-CM

## 2021-05-23 DIAGNOSIS — E103592 Type 1 diabetes mellitus with proliferative diabetic retinopathy without macular edema, left eye: Secondary | ICD-10-CM

## 2021-05-23 DIAGNOSIS — H35033 Hypertensive retinopathy, bilateral: Secondary | ICD-10-CM

## 2021-06-07 DIAGNOSIS — H40051 Ocular hypertension, right eye: Secondary | ICD-10-CM | POA: Diagnosis not present

## 2021-06-25 DIAGNOSIS — E785 Hyperlipidemia, unspecified: Secondary | ICD-10-CM | POA: Diagnosis not present

## 2021-06-25 DIAGNOSIS — E113593 Type 2 diabetes mellitus with proliferative diabetic retinopathy without macular edema, bilateral: Secondary | ICD-10-CM | POA: Diagnosis not present

## 2021-06-25 DIAGNOSIS — Z79899 Other long term (current) drug therapy: Secondary | ICD-10-CM | POA: Diagnosis not present

## 2021-06-28 DIAGNOSIS — I10 Essential (primary) hypertension: Secondary | ICD-10-CM | POA: Diagnosis not present

## 2021-06-28 DIAGNOSIS — E113593 Type 2 diabetes mellitus with proliferative diabetic retinopathy without macular edema, bilateral: Secondary | ICD-10-CM | POA: Diagnosis not present

## 2021-06-28 DIAGNOSIS — E785 Hyperlipidemia, unspecified: Secondary | ICD-10-CM | POA: Diagnosis not present

## 2021-06-28 DIAGNOSIS — R7989 Other specified abnormal findings of blood chemistry: Secondary | ICD-10-CM | POA: Diagnosis not present

## 2021-06-28 DIAGNOSIS — Z6826 Body mass index (BMI) 26.0-26.9, adult: Secondary | ICD-10-CM | POA: Diagnosis not present

## 2021-06-28 DIAGNOSIS — Z139 Encounter for screening, unspecified: Secondary | ICD-10-CM | POA: Diagnosis not present

## 2021-06-28 DIAGNOSIS — Z23 Encounter for immunization: Secondary | ICD-10-CM | POA: Diagnosis not present

## 2021-06-28 DIAGNOSIS — N185 Chronic kidney disease, stage 5: Secondary | ICD-10-CM | POA: Diagnosis not present

## 2021-06-28 DIAGNOSIS — N2581 Secondary hyperparathyroidism of renal origin: Secondary | ICD-10-CM | POA: Diagnosis not present

## 2021-07-10 DIAGNOSIS — D631 Anemia in chronic kidney disease: Secondary | ICD-10-CM | POA: Diagnosis not present

## 2021-07-10 DIAGNOSIS — N185 Chronic kidney disease, stage 5: Secondary | ICD-10-CM | POA: Diagnosis not present

## 2021-07-17 DIAGNOSIS — E1122 Type 2 diabetes mellitus with diabetic chronic kidney disease: Secondary | ICD-10-CM | POA: Diagnosis not present

## 2021-07-17 DIAGNOSIS — I120 Hypertensive chronic kidney disease with stage 5 chronic kidney disease or end stage renal disease: Secondary | ICD-10-CM | POA: Diagnosis not present

## 2021-07-17 DIAGNOSIS — N185 Chronic kidney disease, stage 5: Secondary | ICD-10-CM | POA: Diagnosis not present

## 2021-07-17 DIAGNOSIS — Z7984 Long term (current) use of oral hypoglycemic drugs: Secondary | ICD-10-CM | POA: Diagnosis not present

## 2021-07-17 DIAGNOSIS — D631 Anemia in chronic kidney disease: Secondary | ICD-10-CM | POA: Diagnosis not present

## 2021-07-18 ENCOUNTER — Encounter (INDEPENDENT_AMBULATORY_CARE_PROVIDER_SITE_OTHER): Payer: Medicare Other | Admitting: Ophthalmology

## 2021-07-18 DIAGNOSIS — H35033 Hypertensive retinopathy, bilateral: Secondary | ICD-10-CM

## 2021-07-18 DIAGNOSIS — I10 Essential (primary) hypertension: Secondary | ICD-10-CM | POA: Diagnosis not present

## 2021-07-18 DIAGNOSIS — E103513 Type 1 diabetes mellitus with proliferative diabetic retinopathy with macular edema, bilateral: Secondary | ICD-10-CM

## 2021-07-18 DIAGNOSIS — H43813 Vitreous degeneration, bilateral: Secondary | ICD-10-CM

## 2021-07-23 DIAGNOSIS — H40051 Ocular hypertension, right eye: Secondary | ICD-10-CM | POA: Diagnosis not present

## 2021-08-08 DIAGNOSIS — R7989 Other specified abnormal findings of blood chemistry: Secondary | ICD-10-CM | POA: Diagnosis not present

## 2021-08-10 ENCOUNTER — Other Ambulatory Visit: Payer: Self-pay | Admitting: Family Medicine

## 2021-08-10 DIAGNOSIS — R7989 Other specified abnormal findings of blood chemistry: Secondary | ICD-10-CM

## 2021-08-16 ENCOUNTER — Ambulatory Visit
Admission: RE | Admit: 2021-08-16 | Discharge: 2021-08-16 | Disposition: A | Discharge status: Home / Self Care | Payer: Medicare Other | Source: Ambulatory Visit | Attending: Family Medicine | Admitting: Family Medicine

## 2021-08-16 DIAGNOSIS — R945 Abnormal results of liver function studies: Secondary | ICD-10-CM | POA: Diagnosis not present

## 2021-08-16 DIAGNOSIS — R7989 Other specified abnormal findings of blood chemistry: Secondary | ICD-10-CM

## 2021-08-17 ENCOUNTER — Other Ambulatory Visit: Payer: Self-pay | Admitting: Family Medicine

## 2021-08-17 DIAGNOSIS — R160 Hepatomegaly, not elsewhere classified: Secondary | ICD-10-CM

## 2021-08-24 ENCOUNTER — Ambulatory Visit
Admission: RE | Admit: 2021-08-24 | Discharge: 2021-08-24 | Disposition: A | Discharge status: Home / Self Care | Payer: Medicare Other | Source: Ambulatory Visit | Attending: Family Medicine | Admitting: Family Medicine

## 2021-08-24 DIAGNOSIS — N261 Atrophy of kidney (terminal): Secondary | ICD-10-CM | POA: Diagnosis not present

## 2021-08-24 DIAGNOSIS — R160 Hepatomegaly, not elsewhere classified: Secondary | ICD-10-CM

## 2021-08-24 DIAGNOSIS — K7689 Other specified diseases of liver: Secondary | ICD-10-CM | POA: Diagnosis not present

## 2021-08-24 DIAGNOSIS — K8689 Other specified diseases of pancreas: Secondary | ICD-10-CM | POA: Diagnosis not present

## 2021-08-24 MED ORDER — GADOBENATE DIMEGLUMINE 529 MG/ML IV SOLN
13.0000 mL | Freq: Once | INTRAVENOUS | Status: AC | PRN
  Administered 2021-08-24: 13 mL via INTRAVENOUS

## 2021-08-28 ENCOUNTER — Other Ambulatory Visit (HOSPITAL_COMMUNITY): Payer: Self-pay | Admitting: Family Medicine

## 2021-08-28 ENCOUNTER — Other Ambulatory Visit: Payer: Self-pay | Admitting: Family Medicine

## 2021-08-28 DIAGNOSIS — R160 Hepatomegaly, not elsewhere classified: Secondary | ICD-10-CM

## 2021-08-28 NOTE — Progress Notes (Unsigned)
Rose Mariscal, MD  [name] D ?OK for US guided liver lesion bx.  ? ?Abdominal MRI - image 32, series 11.  ? ?Thx,  ?[name]  ?

## 2021-08-30 ENCOUNTER — Encounter (INDEPENDENT_AMBULATORY_CARE_PROVIDER_SITE_OTHER): Payer: Medicare Other | Admitting: Ophthalmology

## 2021-08-30 DIAGNOSIS — I10 Essential (primary) hypertension: Secondary | ICD-10-CM | POA: Diagnosis not present

## 2021-08-30 DIAGNOSIS — H43813 Vitreous degeneration, bilateral: Secondary | ICD-10-CM | POA: Diagnosis not present

## 2021-08-30 DIAGNOSIS — H35033 Hypertensive retinopathy, bilateral: Secondary | ICD-10-CM | POA: Diagnosis not present

## 2021-08-30 DIAGNOSIS — E113513 Type 2 diabetes mellitus with proliferative diabetic retinopathy with macular edema, bilateral: Secondary | ICD-10-CM | POA: Diagnosis not present

## 2021-08-31 DIAGNOSIS — D631 Anemia in chronic kidney disease: Secondary | ICD-10-CM | POA: Diagnosis not present

## 2021-08-31 DIAGNOSIS — N185 Chronic kidney disease, stage 5: Secondary | ICD-10-CM | POA: Diagnosis not present

## 2021-09-04 ENCOUNTER — Other Ambulatory Visit: Payer: Self-pay | Admitting: Radiology

## 2021-09-04 ENCOUNTER — Other Ambulatory Visit: Payer: Medicare Other

## 2021-09-04 DIAGNOSIS — R160 Hepatomegaly, not elsewhere classified: Secondary | ICD-10-CM

## 2021-09-05 DIAGNOSIS — N189 Chronic kidney disease, unspecified: Secondary | ICD-10-CM | POA: Diagnosis not present

## 2021-09-05 DIAGNOSIS — N185 Chronic kidney disease, stage 5: Secondary | ICD-10-CM | POA: Diagnosis not present

## 2021-09-05 DIAGNOSIS — I120 Hypertensive chronic kidney disease with stage 5 chronic kidney disease or end stage renal disease: Secondary | ICD-10-CM | POA: Diagnosis not present

## 2021-09-05 DIAGNOSIS — R160 Hepatomegaly, not elsewhere classified: Secondary | ICD-10-CM | POA: Diagnosis not present

## 2021-09-05 DIAGNOSIS — D631 Anemia in chronic kidney disease: Secondary | ICD-10-CM | POA: Diagnosis not present

## 2021-09-05 DIAGNOSIS — E1122 Type 2 diabetes mellitus with diabetic chronic kidney disease: Secondary | ICD-10-CM | POA: Diagnosis not present

## 2021-09-05 DIAGNOSIS — M899 Disorder of bone, unspecified: Secondary | ICD-10-CM | POA: Diagnosis not present

## 2021-09-06 ENCOUNTER — Ambulatory Visit (HOSPITAL_COMMUNITY)
Admission: RE | Admit: 2021-09-06 | Discharge: 2021-09-06 | Disposition: A | Discharge status: Home / Self Care | Payer: Medicare Other | Source: Ambulatory Visit | Attending: Family Medicine | Admitting: Family Medicine

## 2021-09-06 ENCOUNTER — Encounter (HOSPITAL_COMMUNITY): Payer: Self-pay

## 2021-09-06 DIAGNOSIS — Z8673 Personal history of transient ischemic attack (TIA), and cerebral infarction without residual deficits: Secondary | ICD-10-CM | POA: Diagnosis not present

## 2021-09-06 DIAGNOSIS — R7989 Other specified abnormal findings of blood chemistry: Secondary | ICD-10-CM | POA: Insufficient documentation

## 2021-09-06 DIAGNOSIS — N189 Chronic kidney disease, unspecified: Secondary | ICD-10-CM | POA: Insufficient documentation

## 2021-09-06 DIAGNOSIS — C220 Liver cell carcinoma: Secondary | ICD-10-CM | POA: Insufficient documentation

## 2021-09-06 DIAGNOSIS — R160 Hepatomegaly, not elsewhere classified: Secondary | ICD-10-CM

## 2021-09-06 DIAGNOSIS — E1122 Type 2 diabetes mellitus with diabetic chronic kidney disease: Secondary | ICD-10-CM | POA: Diagnosis not present

## 2021-09-06 DIAGNOSIS — I129 Hypertensive chronic kidney disease with stage 1 through stage 4 chronic kidney disease, or unspecified chronic kidney disease: Secondary | ICD-10-CM | POA: Insufficient documentation

## 2021-09-06 LAB — COMPREHENSIVE METABOLIC PANEL
ALT: 96 U/L — ABNORMAL HIGH (ref 0–44)
AST: 76 U/L — ABNORMAL HIGH (ref 15–41)
Albumin: 3.5 g/dL (ref 3.5–5.0)
Alkaline Phosphatase: 182 U/L — ABNORMAL HIGH (ref 38–126)
Anion gap: 10 (ref 5–15)
BUN: 86 mg/dL — ABNORMAL HIGH (ref 8–23)
CO2: 19 mmol/L — ABNORMAL LOW (ref 22–32)
Calcium: 8.7 mg/dL — ABNORMAL LOW (ref 8.9–10.3)
Chloride: 111 mmol/L (ref 98–111)
Creatinine, Ser: 5.3 mg/dL — ABNORMAL HIGH (ref 0.44–1.00)
GFR, Estimated: 7 mL/min — ABNORMAL LOW (ref >60)
Glucose, Bld: 94 mg/dL (ref 70–99)
Potassium: 5.5 mmol/L — ABNORMAL HIGH (ref 3.5–5.1)
Sodium: 140 mmol/L (ref 135–145)
Total Bilirubin: 0.5 mg/dL (ref 0.3–1.2)
Total Protein: 6.5 g/dL (ref 6.5–8.1)

## 2021-09-06 LAB — CBC WITH DIFFERENTIAL/PLATELET
Abs Immature Granulocytes: 0.02 10*3/uL (ref 0.00–0.07)
Basophils Absolute: 0 10*3/uL (ref 0.0–0.1)
Basophils Relative: 1 %
Eosinophils Absolute: 0.4 10*3/uL (ref 0.0–0.5)
Eosinophils Relative: 7 %
HCT: 26.1 % — ABNORMAL LOW (ref 36.0–46.0)
Hemoglobin: 8.2 g/dL — ABNORMAL LOW (ref 12.0–15.0)
Immature Granulocytes: 0 %
Lymphocytes Relative: 21 %
Lymphs Abs: 1.2 10*3/uL (ref 0.7–4.0)
MCH: 29.7 pg (ref 26.0–34.0)
MCHC: 31.4 g/dL (ref 30.0–36.0)
MCV: 94.6 fL (ref 80.0–100.0)
Monocytes Absolute: 0.4 10*3/uL (ref 0.1–1.0)
Monocytes Relative: 7 %
Neutro Abs: 3.5 10*3/uL (ref 1.7–7.7)
Neutrophils Relative %: 64 %
Platelets: 210 10*3/uL (ref 150–400)
RBC: 2.76 MIL/uL — ABNORMAL LOW (ref 3.87–5.11)
RDW: 14.3 % (ref 11.5–15.5)
WBC: 5.4 10*3/uL (ref 4.0–10.5)
nRBC: 0 % (ref 0.0–0.2)

## 2021-09-06 LAB — GLUCOSE, CAPILLARY: Glucose-Capillary: 87 mg/dL (ref 70–99)

## 2021-09-06 LAB — PROTIME-INR
INR: 1 (ref 0.8–1.2)
Prothrombin Time: 13.4 seconds (ref 11.4–15.2)

## 2021-09-06 MED ORDER — LIDOCAINE HCL 1 % IJ SOLN
INTRAMUSCULAR | Status: AC
  Administered 2021-09-06: 10 mL
  Filled 2021-09-06: qty 20

## 2021-09-06 MED ORDER — DEXTROSE-NACL 5-0.45 % IV SOLN: INTRAVENOUS | Status: DC

## 2021-09-06 MED ORDER — LIDOCAINE HCL (PF) 1 % IJ SOLN
INTRAMUSCULAR | Status: AC | PRN
  Administered 2021-09-06: 20 mL

## 2021-09-06 MED ORDER — SODIUM CHLORIDE 0.9 % IV SOLN: INTRAVENOUS | Status: DC

## 2021-09-06 MED ORDER — GELATIN ABSORBABLE 12-7 MM EX MISC
CUTANEOUS | Status: AC
  Administered 2021-09-06: 1
  Filled 2021-09-06: qty 1

## 2021-09-06 NOTE — Discharge Instructions (Signed)
Please call Interventional Radiology clinic [phone] with any questions or concerns.  You may remove your dressing and shower tomorrow.   Liver Biopsy, Care After After a liver biopsy, it is common to have these things in the area where the biopsy was done. You may: Have pain. Feel sore. Have bruising. You may also feel tired for a few days. Follow these instructions at home: Medicines Take over-the-counter and prescription medicines only as told by your doctor. If you were prescribed an antibiotic medicine, take it as told by your doctor. Do not stop taking the antibiotic, even if you start to feel better. Do not take medicines that may thin your blood. These medicines include aspirin and ibuprofen. Take them only if your doctor tells you to. If told, take steps to prevent problems with pooping (constipation). You may need to: Drink enough fluid to keep your pee (urine) pale yellow. Take medicines. You will be told what medicines to take. Eat foods that are high in fiber. These include beans, whole grains, and fresh fruits and vegetables. Limit foods that are high in fat and sugar. These include fried or sweet foods. Ask your doctor if you should avoid driving or using machines while you are taking your medicine. Caring for your incision Follow instructions from your doctor about how to take care of your cut from surgery (incisions). Make sure you: Wash your hands with soap and water for at least 20 seconds before and after you change your bandage. If you cannot use soap and water, use hand sanitizer. Change your bandage. Leavestitches or skin glue in place for at least two weeks. Leave tape strips alone unless you are told to take them off. You may trim the edges of the tape strips if they curl up. Check your incision every day for signs of infection. Check for: Redness, swelling, or more pain. Fluid or blood. Warmth. Pus or a bad smell. Do not take baths, swim, or use a hot  tub. Ask your doctor about taking showers or sponge baths. Activity Rest at home for 1-2 days, or as told by your doctor. Get up to take short walks every 1 to 2 hours. Ask for help if you feel weak or unsteady. Do not lift anything that is heavier than 10 lb (4.5 kg), or the limit that you are told. Do not play contact sports for 2 weeks after the procedure. Return to your normal activities as told by your doctor. Ask what activities are safe for you. General instructions Do not drink alcohol in the first week after the procedure. Plan to have a responsible adult care for you for the time you are told after you leave the hospital or clinic. This is important. It is up to you to get the results of your procedure. Ask how to get your results when they are ready. Keep all follow-up visits.   Contact a doctor if: You have more bleeding in your incision. Your incision swells, or is red and more painful. You have fluid that comes from your incision. You develop a rash. You have fever or chills. Get help right away if: You have swelling, bloating, or pain in your belly (abdomen). You get dizzy or faint. You vomit or you feel like vomiting. You have trouble breathing or feel short of breath. You have chest pain. You have problems talking or seeing. You have trouble with your balance or moving your arms or legs. These symptoms may be an emergency. Get help right away.   Call your local emergency services (911 in the [place] Do not wait to see if the symptoms will go away. Do not drive yourself to the hospital. Summary After the procedure, it is common to have pain, soreness, bruising, and tiredness. Your doctor will tell you how to take care of yourself at home. Change your bandage, take your medicines, and limit your activities as told by your doctor. Call your doctor if you have symptoms of infection. Get help right away if your belly swells, your cut bleeds a lot, or you have trouble talking  or breathing. This information is not intended to replace advice given to you by your health care provider. Make sure you discuss any questions you have with your healthcare provider. Document Revised: 02/14/2020 Document Reviewed: 02/14/2020 Elsevier Patient Education  2022 [employer]      

## 2021-09-06 NOTE — Consult Note (Signed)
Chief Complaint: Patient was seen in consultation today for image guided liver lesion biopsy  Referring Physician(s): Hamrick,Maura L  Supervising Physician: Michaelle Birks  Patient Status: [facility] - Out-pt  History of Present Illness: Rose Cruz is an 88 y.o. female with past medical history of diabetes, CKD, hypertension, prior stroke 2011 who presents now with elevated LFTs and imaging revealing:  1. Arterially enhancing 9.9 x 6.0 x 8.3 cm lesion in the central right hepatic lobe demonstrates imaging characteristics most consistent with hepatocellular carcinoma, consider definitive characterization with direct tissue sampling. 2. Small lesion in the peripheral right lobe of the liver just superior to the above lesion, likely reflects a small metastasis. 3. No abdominal adenopathy. 4. Multiple scattered well-circumscribed T2 hyperintense pancreatic lesions measuring up to 6 mm in the body of the pancreas, none of which demonstrate suspicious MRI features and are favored to reflect side branch IPMNs.   She is scheduled today for image guided liver lesion biopsy for further evaluation.  Past Medical History:  Diagnosis Date   Diabetes mellitus without complication ([place])    Hypertension    Stroke [facility])     Past Surgical History:  Procedure Laterality Date   COLON SURGERY     polypectomy 1004   EYE SURGERY     cataract   FRACTURE SURGERY     SPINE SURGERY     Dr Carloyn Manner 2006   TUBAL LIGATION      Allergies: Penicillins and Statins  Medications: Prior to Admission medications   Medication Sig Start Date End Date Taking? Authorizing Provider  amLODipine (NORVASC) 5 MG tablet Take 1 tablet (5 mg total) by mouth daily. Patient taking differently: Take 5 mg by mouth 2 (two) times daily. 01/25/17  Yes McVey, Gelene Mink, PA-C  aspirin 81 MG EC tablet Take by mouth.   Yes [provider]  atorvastatin (LIPITOR) 40 MG tablet Take 40 mg by mouth daily. 1/2  tablet evenings   Yes [provider]  brimonidine (ALPHAGAN) 0.2 % ophthalmic solution Place 1 drop into the right eye 2 times daily. 10/25/20  Yes [provider]  calcium acetate (PHOSLO) 667 MG capsule  11/27/20  Yes [provider]  cloNIDine (CATAPRES) 0.1 MG tablet Take 0.1 mg by mouth 2 (two) times daily.   Yes [provider]  dorzolamide-timolol (COSOPT) 22.3-6.8 MG/ML ophthalmic solution  11/27/20  Yes [provider]  Ferrous Sulfate Dried 200 (65 FE) MG TABS Take by mouth.   Yes [provider]  glimepiride (AMARYL) 2 MG tablet Take 2 mg by mouth daily with breakfast. Takes 1/2 tablet   Yes [provider]  hydrALAZINE (APRESOLINE) 25 MG tablet Take 25 mg by mouth 3 (three) times daily.   Yes [provider]  hydrALAZINE (APRESOLINE) 25 MG tablet Take by mouth. 12/19/20 12/19/21 Yes [provider]  losartan (COZAAR) 100 MG tablet Take 100 mg by mouth daily. 12/17/20  Yes [provider]  Multiple Vitamin (MULTIVITAMIN) tablet Take 1 tablet by mouth daily.   Yes [provider]  amLODipine (NORVASC) 10 MG tablet Take 10 mg by mouth daily. 12/30/20   [provider]  aspirin 81 MG tablet Take 81 mg by mouth daily.    [provider]  atorvastatin (LIPITOR) 40 MG tablet 1 tablet 10/22/16   [provider]  cloNIDine (CATAPRES) 0.1 MG tablet Take by mouth. 10/24/16   [provider]  ferrous sulfate 324 MG TBEC Take by mouth.  [provider]  losartan-hydrochlorothiazide (HYZAAR) 100-25 MG tablet Take 1 tablet by mouth daily. 05/31/16   Harrison Mons, PA  MULTIPLE VITAMIN PO 1 tablet    [provider]     Family History  Problem Relation Age of Onset   Cancer Mother    Cancer Father    Cancer Sister    Cancer Brother     Social History   Socioeconomic History   Marital status: Widowed    Spouse name: Not on file   Number of  children: Not on file   Years of education: Not on file   Highest education level: Not on file  Occupational History   Occupation: driver [employer]    Employer: [employer]    Comment: works one morning a week  Tobacco Use   Smoking status: Never   Smokeless tobacco: Never  Vaping Use   Vaping Use: Never used  Substance and Sexual Activity   Alcohol use: No   Drug use: No   Sexual activity: Never  Other Topics Concern   Not on file  Social History Narrative   Seldom exercises.   Social Determinants of Health   Financial Resource Strain: Not on file  Food Insecurity: Not on file  Transportation Needs: Not on file  Physical Activity: Not on file  Stress: Not on file  Social Connections: Not on file      Review of Systems he denies fever, headache, chest pain, dyspnea, cough, nausea, vomiting or bleeding.  She does have some occasional abdominal pain, did have some diarrhea last night and has chronic back pain  Vital Signs: BP (!) 185/58   Pulse 67   Temp (!) 97.5 F (36.4 C) (Oral)   Resp 18   SpO2 100%   Physical Exam awake, alert.  Chest clear to auscultation bilaterally.  Heart with regular rate and rhythm, positive murmur.  Abdomen soft, positive bowel sounds, nontender.  No lower extremity edema.  Imaging: MR ABDOMEN WWO CONTRAST  Result Date: 08/27/2021 CLINICAL DATA:  Further evaluation of mass seen in the right lobe of the liver. EXAM: MRI ABDOMEN WITHOUT AND WITH CONTRAST TECHNIQUE: Multiplanar multisequence MR imaging of the abdomen was performed both before and after the administration of intravenous contrast. CONTRAST:  43m MULTIHANCE GADOBENATE DIMEGLUMINE 529 MG/ML IV SOLN COMPARISON:  Ultrasound Aug 16, 2021 FINDINGS: Lower chest: Limited MR evaluation reveals no acute abnormality. Hepatobiliary: Arterially enhancing lesion in the central right hepatic lobe measures 9.9 x 6.0 x 8.3 cm on images 36/11 and 36/17, the lesion demonstrates washout, some central  necrosis and probable enhancing capsule. There is effacement of the vascular structures at the hepatic hilum without discrete portal venous tumoral invasion. Small lesion in the peripheral right lobe of the liver just superior to the above lesion which measures 15 mm on image 19/3 and demonstrates similar signal characteristics. Gallbladder is unremarkable.  No biliary ductal dilation. Pancreas: No pancreatic ductal dilation or evidence of acute inflammation. Pancreatic atrophy. There are multiple scattered well-circumscribed T2 hyperintense pancreatic lesions measuring up to 6 mm in the body of the pancreas on image 18/4, none of which demonstrate suspicious postcontrast enhancement or wall thickening. Spleen:  No splenomegaly or focal splenic lesion. Adrenals/Urinary Tract: Bilateral adrenal glands appear normal. No hydronephrosis. No suspicious renal lesion. Slight atrophy of the left kidney with lobular renal contour likely reflecting scarring. Stomach/Bowel: Visualized portions within the abdomen are unremarkable. Vascular/Lymphatic: Normal caliber abdominal aorta. The portal, splenic and superior mesenteric veins are  patent. No pathologically enlarged abdominal lymph nodes. Other:  No significant abdominal free fluid. Musculoskeletal: No suspicious bone lesions identified. IMPRESSION: 1. Arterially enhancing 9.9 x 6.0 x 8.3 cm lesion in the central right hepatic lobe demonstrates imaging characteristics most consistent with hepatocellular carcinoma, consider definitive characterization with direct tissue sampling. 2. Small lesion in the peripheral right lobe of the liver just superior to the above lesion, likely reflects a small metastasis. 3. No abdominal adenopathy. 4. Multiple scattered well-circumscribed T2 hyperintense pancreatic lesions measuring up to 6 mm in the body of the pancreas, none of which demonstrate suspicious MRI features and are favored to reflect side branch IPMNs. This potential oncologic  patient would suggest attention on follow-up imaging. These results will be called to the ordering clinician or representative by the Radiologist Assistant, and communication documented in the PACS or [employer]. Electronically Signed   By: Dahlia Bailiff M.D.   On: 08/27/2021 16:34   US Abdomen Limited RUQ (LIVER/GB)  Result Date: 08/16/2021 CLINICAL DATA:  Elevated LFTs. EXAM: ULTRASOUND ABDOMEN LIMITED RIGHT UPPER QUADRANT COMPARISON:  CT scan Aug 16, 2003. Right upper quadrant ultrasound February 12, 2013. FINDINGS: Gallbladder: No gallstones or wall thickening visualized. No sonographic Murphy sign noted by sonographer. Common bile duct: Diameter: 1.9 mm Liver: There is a solid mass in the right hepatic lobe measuring 7.8 x 5.6 x 7.5 cm. The remainder of the visualized liver is normal in appearance. Portal vein is patent on color Doppler imaging with normal direction of blood flow towards the liver. Other: None. IMPRESSION: 1. There is a 7.8 x 5.6 x 7.5 cm mass in the liver not present on previous imaging. This finding is concerning for neoplasm/malignancy. Recommend MRI of the abdomen for better evaluation. 2. The gallbladder and common bile duct are normal. These results will be called to the ordering clinician or representative by the Radiologist Assistant, and communication documented in the PACS or [employer]. Electronically Signed   By: Dorise Bullion III M.D.   On: 08/16/2021 12:17    Labs:  CBC: Recent Labs    09/06/21 1130  WBC 5.4  HGB 8.2*  HCT 26.1*  PLT 210    COAGS: Recent Labs    09/06/21 1130  INR 1.0    BMP: Recent Labs    09/06/21 1130  NA 140  K 5.5*  CL 111  CO2 19*  GLUCOSE 94  BUN 86*  CALCIUM 8.7*  CREATININE 5.30*  GFRNONAA 7*    LIVER FUNCTION TESTS: Recent Labs    09/06/21 1130  BILITOT 0.5  AST 76*  ALT 96*  ALKPHOS 182*  PROT 6.5  ALBUMIN 3.5    TUMOR MARKERS: No results for input(s): AFPTM, CEA, CA199, CHROMGRNA in the  last 8760 hours.  Assessment and Plan: 88 y.o. female with past medical history of diabetes, CKD, hypertension, prior stroke 2011 who presents now with elevated LFTs and imaging revealing:  1. Arterially enhancing 9.9 x 6.0 x 8.3 cm lesion in the central right hepatic lobe demonstrates imaging characteristics most consistent with hepatocellular carcinoma, consider definitive characterization with direct tissue sampling. 2. Small lesion in the peripheral right lobe of the liver just superior to the above lesion, likely reflects a small metastasis. 3. No abdominal adenopathy. 4. Multiple scattered well-circumscribed T2 hyperintense pancreatic lesions measuring up to 6 mm in the body of the pancreas, none of which demonstrate suspicious MRI features and are favored to reflect side branch IPMNs.   She is scheduled today  for image guided liver lesion biopsy for further evaluation.Risks and benefits of procedure  was discussed with the patient /son including, but not limited to bleeding, infection, damage to adjacent structures or low yield requiring additional tests.  All of the questions were answered and there is agreement to proceed.  Consent signed and in chart.    Thank you for this interesting consult.  I greatly enjoyed meeting HARSHIKA MAGO and look forward to participating in their care.  A copy of this report was sent to the requesting provider on this date.  Electronically Signed: D. Rowe Robert, PA-C 09/06/2021, 12:28 PM   I spent a total of  25 minutes   in face to face in clinical consultation, greater than 50% of which was counseling/coordinating care for image guided liver lesion biopsy

## 2021-09-06 NOTE — Procedures (Signed)
Vascular and Interventional Radiology Procedure Note  Patient: Rose Cruz DOB: Sep 06, 1932 Medical Record Number: [id number] Note Date/Time: 09/06/21 2:10 PM   Performing Physician: Michaelle Birks, MD Assistant(s): None  Diagnosis: Liver mass  Procedure: LIVER MASS BIOPSY  Anesthesia: Local Anesthetic Complications: None Estimated Blood Loss: Minimal Specimens: Sent for Pathology  Findings:  Successful Ultrasound-guided biopsy of R hepatic lobe mass. A total of 3 samples were obtained. Hemostasis of the tract was achieved using Gelfoam Slurry Embolization.  Plan: Bed rest for 2 hours.  See detailed procedure note with images in PACS. The patient tolerated the procedure well without incident or complication and was returned to Short Stay in stable condition.    Michaelle Birks, MD [facility]   Pager. [place]

## 2021-09-06 NOTE — Sedation Documentation (Signed)
Pt did not want sedation

## 2021-09-11 LAB — SURGICAL PATHOLOGY

## 2021-09-13 ENCOUNTER — Telehealth: Payer: Self-pay | Admitting: Physician Assistant

## 2021-09-13 NOTE — Telephone Encounter (Signed)
Scheduled appt per 6/1 referral. Pt is aware of appt date and time. Pt is aware to arrive 15 mins prior to appt time and to bring and updated insurance card. Pt is aware of appt location.

## 2021-09-16 NOTE — Progress Notes (Addendum)
[place] [phone]   Fax:([phone]) [place] NOTE  Patient Care Team: Hamrick, Lorin Mercy, MD as PCP - General (Family Medicine)  CHIEF COMPLAINTS/PURPOSE OF CONSULTATION:  Hepatocellular carcinoma  ONCOLOGIC HISTORY: 08/16/2021: Abdominal US: A solid mass in the right hepatic lobe measuring 7.8 x 5.6 x 7.5 cm.  08/24/2021: MRI abdomen: Arterially enhancing 9.9 x 6.0 x 8.3 cm lesion in the central right hepatic lobe demonstrates imaging characteristics most consistent with hepatocellular carcinoma. Small lesion in the peripheral right lobe of the liver just superior to the above lesion, likely reflects a small metastasis. No abdominal adenopathy. Multiple scattered well-circumscribed T2 hyperintense pancreatic lesions measuring up to 6 mm in the body of the pancreas, none of which demonstrate suspicious MRI features and are favored to reflect side branch IPMNs.  09/06/2021: Underwent US guided liver biopsy. Pathology confirmed well-differentiated hepatocellular carcinoma.    HISTORY OF PRESENTING ILLNESS:  KORRI Rose Cruz 88 y.o. female with medical history significant for diabetes, hypertension and stroke.  She is accompanied by her son for this visit  On exam today, reports experiencing progressive fatigue but she is able to complete her daily activities on her own.  Her appetite is unchanged and she denies any recent weight loss.  Patient denies nausea many episodes.  She does experience some right upper quadrant pressure especially when she sleeps on the right side.  She reports chronic diarrhea, 2 episodes per week.  He takes over-the-counter diarrheal's as needed with improvement of symptoms.  She denies easy bruising or signs of active bleeding.  Patient reports some shortness of breath mainly with heavy exertion.  She has occasional swelling in her ankles that improves with elevation of legs.  Patient denies fevers, chills, night sweats, chest pain  or cough.  She has no other complaints.  Rest of the 10 point ROS is below.  MEDICAL HISTORY:  Past Medical History:  Diagnosis Date   CKD (chronic kidney disease), stage V ([place])    Diabetes mellitus without complication ([place])    Hypertension    Stroke [facility])     SURGICAL HISTORY: Past Surgical History:  Procedure Laterality Date   COLON SURGERY     polypectomy 1004   EYE SURGERY     cataract   FRACTURE SURGERY     SPINE SURGERY     Dr Carloyn Manner 2006   TUBAL LIGATION      SOCIAL HISTORY: Social History   Socioeconomic History   Marital status: Widowed    Spouse name: Not on file   Number of children: Not on file   Years of education: Not on file   Highest education level: Not on file  Occupational History   Occupation: driver gaa    Employer: [employer]    Comment: works one morning a week  Tobacco Use   Smoking status: Never   Smokeless tobacco: Never  Vaping Use   Vaping Use: Never used  Substance and Sexual Activity   Alcohol use: No   Drug use: No   Sexual activity: Never  Other Topics Concern   Not on file  Social History Narrative   Seldom exercises.   Social Determinants of Health   Financial Resource Strain: Not on file  Food Insecurity: Not on file  Transportation Needs: Not on file  Physical Activity: Not on file  Stress: Not on file  Social Connections: Not on file  Intimate Partner Violence: Not on file    FAMILY HISTORY: Family  History  Problem Relation Age of Onset   Cancer - Other Mother    Cancer Father    Liver cancer Sister    Cancer - Lung Brother        smoker   Kidney cancer Son     ALLERGIES:  is allergic to penicillins and statins.  MEDICATIONS:  Current Outpatient Medications  Medication Sig Dispense Refill   amLODipine (NORVASC) 5 MG tablet Take 1 tablet (5 mg total) by mouth daily. (Patient taking differently: Take 5 mg by mouth 2 (two) times daily.) 90 tablet 0   aspirin 81 MG tablet Take 81 mg by mouth 2 (two) times  daily.     atorvastatin (LIPITOR) 40 MG tablet Take 40 mg by mouth daily. 1/2 tablet evenings     brimonidine (ALPHAGAN) 0.2 % ophthalmic solution Place 1 drop into the right eye 2 times daily.     cloNIDine (CATAPRES) 0.1 MG tablet Take 0.1 mg by mouth 2 (two) times daily.     dorzolamide-timolol (COSOPT) 22.3-6.8 MG/ML ophthalmic solution      Ferrous Sulfate Dried 200 (65 FE) MG TABS Take by mouth.     glimepiride (AMARYL) 2 MG tablet Take 2 mg by mouth daily with breakfast. Takes 1/2 tablet     hydrALAZINE (APRESOLINE) 25 MG tablet Take 25 mg by mouth 2 (two) times daily.     losartan-hydrochlorothiazide (HYZAAR) 100-25 MG tablet Take 1 tablet by mouth daily. 30 tablet 1   Multiple Vitamin (MULTIVITAMIN) tablet Take 1 tablet by mouth daily.     amLODipine (NORVASC) 10 MG tablet Take 10 mg by mouth daily.     aspirin 81 MG EC tablet Take by mouth.     atorvastatin (LIPITOR) 40 MG tablet 1 tablet     calcium acetate (PHOSLO) 667 MG capsule  (Patient not taking: Reported on 09/17/2021)     cloNIDine (CATAPRES) 0.1 MG tablet Take by mouth.     ferrous sulfate 324 MG TBEC Take by mouth. (Patient not taking: Reported on 09/17/2021)     hydrALAZINE (APRESOLINE) 25 MG tablet Take by mouth.     losartan (COZAAR) 100 MG tablet Take 100 mg by mouth daily.     MULTIPLE VITAMIN PO 1 tablet     No current facility-administered medications for this visit.    REVIEW OF SYSTEMS:   Constitutional: ( - ) fevers, ( - )  chills , ( - ) night sweats Eyes: ( - ) blurriness of vision, ( - ) double vision, ( - ) watery eyes Ears, nose, mouth, throat, and face: ( - ) mucositis, ( - ) sore throat Respiratory: ( - ) cough, ( + ) dyspnea, ( - ) wheezes Cardiovascular: ( - ) palpitation, ( - ) chest discomfort, ( + ) lower extremity swelling Gastrointestinal:  ( - ) nausea, ( - ) heartburn, ( - ) change in bowel habits Skin: ( - ) abnormal skin rashes Lymphatics: ( - ) new lymphadenopathy, ( - ) easy  bruising Neurological: ( - ) numbness, ( - ) tingling, ( - ) new weaknesses Behavioral/Psych: ( - ) mood change, ( - ) new changes  All other systems were reviewed with the patient and are negative.  PHYSICAL EXAMINATION: ECOG PERFORMANCE STATUS: 1 - Symptomatic but completely ambulatory  Vitals:   09/17/21 1251  BP: (!) 169/62  Pulse: 76  Resp: 16  Temp: (!) 97.5 F (36.4 C)  SpO2: 98%   Filed Weights   09/17/21 1251  Weight: 140 lb 8 oz (63.7 kg)    GENERAL: well appearing female in NAD  SKIN: skin color, texture, turgor are normal, no rashes or significant lesions EYES: conjunctiva are pink and non-injected, sclera clear OROPHARYNX: no exudate, no erythema; lips, buccal mucosa, and tongue normal  NECK: supple, non-tender LYMPH:  no palpable lymphadenopathy in the cervical or supraclavicular lymph nodes.  LUNGS: clear to auscultation and percussion with normal breathing effort HEART: regular rate & rhythm and no murmurs and no lower extremity edema ABDOMEN: soft, non-tender, non-distended, normal bowel sounds Musculoskeletal: no cyanosis of digits and no clubbing  PSYCH: alert & oriented x 3, fluent speech NEURO: no focal motor/sensory deficits  LABORATORY DATA:  I have reviewed the data as listed    Latest Ref Rng & Units 09/17/2021    2:37 PM 09/06/2021   11:30 AM 01/25/2017   10:25 AM  CBC  WBC 4.0 - 10.5 K/uL 5.8   5.4   6.2    Hemoglobin 12.0 - 15.0 g/dL 8.9   8.2   9.0    Hematocrit 36.0 - 46.0 % 27.5   26.1   26.8    Platelets 150 - 400 K/uL 259   210          Latest Ref Rng & Units 09/17/2021    2:37 PM 09/06/2021   11:30 AM 01/25/2017   11:32 AM  CMP  Glucose 70 - 99 mg/dL 91   94   138    BUN 8 - 23 mg/dL 65   86   31    Creatinine 0.44 - 1.00 mg/dL 5.23   5.30   1.67    Sodium 135 - 145 mmol/L 139   140   129    Potassium 3.5 - 5.1 mmol/L 5.0   5.5   4.5    Chloride 98 - 111 mmol/L 107   111   93    CO2 22 - 32 mmol/L '23   19   22    '$ Calcium 8.9 -  10.3 mg/dL 9.1   8.7   9.6    Total Protein 6.5 - 8.1 g/dL 6.3   6.5   6.5    Total Bilirubin 0.3 - 1.2 mg/dL 0.3   0.5   0.2    Alkaline Phos 38 - 126 U/L 217   182   82    AST 15 - 41 U/L 71   76   23    ALT 0 - 44 U/L 97   96   16       PATHOLOGY:  FINAL MICROSCOPIC DIAGNOSIS: A. LIVER, BIOPSY: - Well-differentiated hepatocellular carcinoma  RADIOGRAPHIC STUDIES: I have personally reviewed the radiological images as listed and agreed with the findings in the report. MR ABDOMEN WWO CONTRAST  Result Date: 08/27/2021 CLINICAL DATA:  Further evaluation of mass seen in the right lobe of the liver. EXAM: MRI ABDOMEN WITHOUT AND WITH CONTRAST TECHNIQUE: Multiplanar multisequence MR imaging of the abdomen was performed both before and after the administration of intravenous contrast. CONTRAST:  53m MULTIHANCE GADOBENATE DIMEGLUMINE 529 MG/ML IV SOLN COMPARISON:  Ultrasound Aug 16, 2021 FINDINGS: Lower chest: Limited MR evaluation reveals no acute abnormality. Hepatobiliary: Arterially enhancing lesion in the central right hepatic lobe measures 9.9 x 6.0 x 8.3 cm on images 36/11 and 36/17, the lesion demonstrates washout, some central necrosis and probable enhancing capsule. There is effacement of the vascular structures at the hepatic hilum without discrete portal venous tumoral  invasion. Small lesion in the peripheral right lobe of the liver just superior to the above lesion which measures 15 mm on image 19/3 and demonstrates similar signal characteristics. Gallbladder is unremarkable.  No biliary ductal dilation. Pancreas: No pancreatic ductal dilation or evidence of acute inflammation. Pancreatic atrophy. There are multiple scattered well-circumscribed T2 hyperintense pancreatic lesions measuring up to 6 mm in the body of the pancreas on image 18/4, none of which demonstrate suspicious postcontrast enhancement or wall thickening. Spleen:  No splenomegaly or focal splenic lesion. Adrenals/Urinary  Tract: Bilateral adrenal glands appear normal. No hydronephrosis. No suspicious renal lesion. Slight atrophy of the left kidney with lobular renal contour likely reflecting scarring. Stomach/Bowel: Visualized portions within the abdomen are unremarkable. Vascular/Lymphatic: Normal caliber abdominal aorta. The portal, splenic and superior mesenteric veins are patent. No pathologically enlarged abdominal lymph nodes. Other:  No significant abdominal free fluid. Musculoskeletal: No suspicious bone lesions identified. IMPRESSION: 1. Arterially enhancing 9.9 x 6.0 x 8.3 cm lesion in the central right hepatic lobe demonstrates imaging characteristics most consistent with hepatocellular carcinoma, consider definitive characterization with direct tissue sampling. 2. Small lesion in the peripheral right lobe of the liver just superior to the above lesion, likely reflects a small metastasis. 3. No abdominal adenopathy. 4. Multiple scattered well-circumscribed T2 hyperintense pancreatic lesions measuring up to 6 mm in the body of the pancreas, none of which demonstrate suspicious MRI features and are favored to reflect side branch IPMNs. This potential oncologic patient would suggest attention on follow-up imaging. These results will be called to the ordering clinician or representative by the Radiologist Assistant, and communication documented in the PACS or [employer]. Electronically Signed   By: Dahlia Bailiff M.D.   On: 08/27/2021 16:34   US BIOPSY (LIVER)  Result Date: 09/06/2021 INDICATION: Liver mass.  No diagnosis. EXAM: ULTRASOUND GUIDED LIVER LESION BIOPSY COMPARISON:  US Abdomen, 08/16/2021.  MRI abdomen, 08/24/2021 MEDICATIONS: None ANESTHESIA/SEDATION: Local anesthetic was administered. The patient was continuously monitored during the procedure by the interventional radiology nurse under my direct supervision. COMPLICATIONS: None immediate. PROCEDURE: Informed written consent was obtained from the  patient and/or patient's representative after a discussion of the risks, benefits and alternatives to treatment. The patient understands and consents the procedure. A timeout was performed prior to the initiation of the procedure. Ultrasound scanning was performed of the right upper abdominal quadrant demonstrates a large RIGHT hepatic lobe mass measuring approximately 8.3 x 7.0 x 7.3 cm. The RIGHT hepatic lobe mass was selected for biopsy and the procedure was planned. The right upper abdominal quadrant was prepped and draped in the usual sterile fashion. The overlying soft tissues were anesthetized with 1% lidocaine with epinephrine. A 17 gauge, 6.8 cm co-axial needle was advanced into a peripheral aspect of the lesion. This was followed by 3 core biopsies with an 18 gauge core device under direct ultrasound guidance. The coaxial needle tract was embolized with a small amount of Gel-Foam slurry and superficial hemostasis was obtained with manual compression. Post procedural scanning was negative for definitive area of hemorrhage or additional complication. A dressing was placed. The patient tolerated the procedure well without immediate post procedural complication. IMPRESSION: Successful ultrasound guided targeted biopsy of RIGHT hepatic lobe mass. Michaelle Birks, MD [facility] Radiology Electronically Signed   By: Michaelle Birks M.D.   On: 09/06/2021 14:52    ASSESSMENT & PLAN AMAIYAH NORDHOFF is a 88 y.o. female who presents to the clinic for newly diagnosed  hepatocellular carcinoma.  We reviewed the recent MRI imaging and pathology report in detail that showed a dominant mass measuring 9.9 x 6.0 x 8.3 cm in the central right hepatic lobe along with a small peripheral metastasis superior to the dominant mass.   Discussed different treatment interventions including surgical resection, liver directed therapy and systemic therapies.  Based on patient's age,  comorbidities and size of the liver mass, it is unlikely that she will be a candidate for surgery.  We will request a referral to interventional radiology to discuss liver directed therapies.  If patient is only a candidate for systemic therapy, likely recommend immunotherapy.  We will finalize treatment recommendations after patient completes staging with CT chest, laboratory evaluation today followed by consultation with interventional radiology.  #Hepatocellular carcinoma:  --Labs today to check CBC, CMP, AFP tumor marker --CT chest to complete staging and rule out metastatic disease --Referral to IR to evaluate for liver directed therapies --Consider systemic therapy with immunotherapy if not a candidate for liver directed therapy. --RTC once above workup is complete  #CKD, stage 5: --Under the care of Dr. Biagio Quint, nephrologist at [facility]. --Patient has declined dialysis at this time  --Previously received Epo injection last on 07/17/2021. Holding future injections due to diagnosis of [place].   #Family history:  --Patient  has strong family history of cancer. Will request consultation with our genetic counselors.  #Iron deficiency anemia: --Currently takes ferrous sulfate  --Will check iron panel today to determine if she needs to continue oral iron.   Orders Placed This Encounter  Procedures   IR Radiologist Eval & Mgmt    Standing Status:   Future    Standing Expiration Date:   09/18/2022    Order Specific Question:   Reason for Exam (SYMPTOM  OR DIAGNOSIS REQUIRED)    Answer:   newly diagnosed hepatocellular carcinoma    Order Specific Question:   Informed consent is required to be obtained if the patient has a GFR less than 45m/min (consider nephrology consult)    Answer:   Informed consent obtained    Order Specific Question:   [facility]    [facility]    Standing Status:   Future     Standing Expiration Date:   09/17/2022    Order Specific Question:   [facility]?    [facility]    Standing Status:   Future    Number of Occurrences:   1    Standing Expiration Date:   09/18/2022   C[place])    Standing Status:   Future    Number of Occurrences:   1    Standing Expiration Date:   09/18/2022   Ferritin    Standing Status:   Future    Number of Occurrences:   1    Standing Expiration Date:   09/18/2022   Iron and Iron Binding Capacity ([facility] only)    Standing Status:   Future    Number of Occurrences:   1    Standing Expiration Date:   09/18/2022   AFP tumor marker    Standing Status:   Future    Number of Occurrences:   1    Standing Expiration Date:   09/17/2022    All questions were answered. The patient knows to call the clinic with  any problems, questions or concerns.  I have spent a total of 60 minutes minutes of face-to-face and non-face-to-face time, preparing to see the patient, obtaining and/or reviewing separately obtained history, performing a medically appropriate examination, counseling and educating the patient, ordering tests/procedures, referring and communicating with other health care professionals, documenting clinical information in the electronic health record,  and care coordination.   Dede Query, PA-C Department of Hematology/Oncology [place] at [facility] Phone: [phone]  Patient was seen with Dr. Lorenso Courier  I have read the above note and personally examined the patient. I agree with the assessment and plan as noted above.  Briefly Mrs. Gasser is an 88 year old female who presents for evaluation of a newly diagnosed mass of the liver, found to be [place].  Given the patient's advanced age, poor kidney function, and size of the mass I do not believe her to be a surgical candidate.  We could consider local therapy and will  reach out to interventional radiology to see if they think she may be a candidate for any of those modalities.  In the event she is not a candidate for those treatments due to her poor kidney function I think she is a poor candidate for most treatments, though palliative immunotherapy could be considered.  The patient voiced her understanding of this plan moving forward.   Ledell Peoples, MD Department of Hematology/Oncology [place] at [facility] Phone: [phone] Pager: [phone] Email: [email or web address]

## 2021-09-17 ENCOUNTER — Encounter: Payer: Self-pay | Admitting: Physician Assistant

## 2021-09-17 ENCOUNTER — Other Ambulatory Visit: Payer: Self-pay

## 2021-09-17 ENCOUNTER — Inpatient Hospital Stay: Payer: Medicare Other | Attending: Physician Assistant

## 2021-09-17 ENCOUNTER — Inpatient Hospital Stay (HOSPITAL_BASED_OUTPATIENT_CLINIC_OR_DEPARTMENT_OTHER): Payer: Medicare Other | Admitting: Physician Assistant

## 2021-09-17 VITALS — BP 169/62 | HR 76 | Temp 97.5°F | Resp 16 | Wt 140.5 lb

## 2021-09-17 DIAGNOSIS — N185 Chronic kidney disease, stage 5: Secondary | ICD-10-CM

## 2021-09-17 DIAGNOSIS — C220 Liver cell carcinoma: Secondary | ICD-10-CM | POA: Insufficient documentation

## 2021-09-17 DIAGNOSIS — Z8051 Family history of malignant neoplasm of kidney: Secondary | ICD-10-CM | POA: Diagnosis not present

## 2021-09-17 DIAGNOSIS — Z801 Family history of malignant neoplasm of trachea, bronchus and lung: Secondary | ICD-10-CM

## 2021-09-17 DIAGNOSIS — E1122 Type 2 diabetes mellitus with diabetic chronic kidney disease: Secondary | ICD-10-CM

## 2021-09-17 DIAGNOSIS — I120 Hypertensive chronic kidney disease with stage 5 chronic kidney disease or end stage renal disease: Secondary | ICD-10-CM | POA: Insufficient documentation

## 2021-09-17 DIAGNOSIS — D509 Iron deficiency anemia, unspecified: Secondary | ICD-10-CM | POA: Diagnosis not present

## 2021-09-17 DIAGNOSIS — Z8673 Personal history of transient ischemic attack (TIA), and cerebral infarction without residual deficits: Secondary | ICD-10-CM

## 2021-09-17 DIAGNOSIS — Z809 Family history of malignant neoplasm, unspecified: Secondary | ICD-10-CM | POA: Diagnosis not present

## 2021-09-17 DIAGNOSIS — R197 Diarrhea, unspecified: Secondary | ICD-10-CM | POA: Diagnosis not present

## 2021-09-17 DIAGNOSIS — E119 Type 2 diabetes mellitus without complications: Secondary | ICD-10-CM

## 2021-09-17 LAB — CBC WITH DIFFERENTIAL (CANCER CENTER ONLY)
Abs Immature Granulocytes: 0.01 10*3/uL (ref 0.00–0.07)
Basophils Absolute: 0 10*3/uL (ref 0.0–0.1)
Basophils Relative: 1 %
Eosinophils Absolute: 0.5 10*3/uL (ref 0.0–0.5)
Eosinophils Relative: 8 %
HCT: 27.5 % — ABNORMAL LOW (ref 36.0–46.0)
Hemoglobin: 8.9 g/dL — ABNORMAL LOW (ref 12.0–15.0)
Immature Granulocytes: 0 %
Lymphocytes Relative: 22 %
Lymphs Abs: 1.3 10*3/uL (ref 0.7–4.0)
MCH: 29.9 pg (ref 26.0–34.0)
MCHC: 32.4 g/dL (ref 30.0–36.0)
MCV: 92.3 fL (ref 80.0–100.0)
Monocytes Absolute: 0.5 10*3/uL (ref 0.1–1.0)
Monocytes Relative: 9 %
Neutro Abs: 3.5 10*3/uL (ref 1.7–7.7)
Neutrophils Relative %: 60 %
Platelet Count: 259 10*3/uL (ref 150–400)
RBC: 2.98 MIL/uL — ABNORMAL LOW (ref 3.87–5.11)
RDW: 14 % (ref 11.5–15.5)
WBC Count: 5.8 10*3/uL (ref 4.0–10.5)
nRBC: 0 % (ref 0.0–0.2)

## 2021-09-17 LAB — CMP (CANCER CENTER ONLY)
ALT: 97 U/L — ABNORMAL HIGH (ref 0–44)
AST: 71 U/L — ABNORMAL HIGH (ref 15–41)
Albumin: 3.7 g/dL (ref 3.5–5.0)
Alkaline Phosphatase: 217 U/L — ABNORMAL HIGH (ref 38–126)
Anion gap: 9 (ref 5–15)
BUN: 65 mg/dL — ABNORMAL HIGH (ref 8–23)
CO2: 23 mmol/L (ref 22–32)
Calcium: 9.1 mg/dL (ref 8.9–10.3)
Chloride: 107 mmol/L (ref 98–111)
Creatinine: 5.23 mg/dL (ref 0.44–1.00)
GFR, Estimated: 8 mL/min — ABNORMAL LOW (ref >60)
Glucose, Bld: 91 mg/dL (ref 70–99)
Potassium: 5 mmol/L (ref 3.5–5.1)
Sodium: 139 mmol/L (ref 135–145)
Total Bilirubin: 0.3 mg/dL (ref 0.3–1.2)
Total Protein: 6.3 g/dL — ABNORMAL LOW (ref 6.5–8.1)

## 2021-09-17 LAB — IRON AND IRON BINDING CAPACITY (CC-WL,HP ONLY)
Iron: 50 ug/dL (ref 28–170)
Saturation Ratios: 17 % (ref 10.4–31.8)
TIBC: 291 ug/dL (ref 250–450)
UIBC: 241 ug/dL (ref 148–442)

## 2021-09-17 LAB — FERRITIN: Ferritin: 248 ng/mL (ref 11–307)

## 2021-09-18 DIAGNOSIS — C220 Liver cell carcinoma: Secondary | ICD-10-CM | POA: Insufficient documentation

## 2021-09-18 LAB — AFP TUMOR MARKER: AFP, Serum, Tumor Marker: 459 ng/mL — ABNORMAL HIGH (ref 0.0–8.7)

## 2021-09-18 NOTE — Progress Notes (Signed)
I met with Rose Cruz and her sone during her consultation with Dede Query, PA-C and Dr Lorenso Courier.  I explained my role as a nurse navigator and provided my contact information. I explained the services provided at [facility] and provided written information.  All questions were answered. They verbalized understanding.

## 2021-09-19 ENCOUNTER — Encounter: Payer: Self-pay | Admitting: *Deleted

## 2021-09-19 ENCOUNTER — Ambulatory Visit
Admission: RE | Admit: 2021-09-19 | Discharge: 2021-09-19 | Disposition: A | Discharge status: Home / Self Care | Payer: Medicare Other | Source: Ambulatory Visit | Attending: Physician Assistant | Admitting: Physician Assistant

## 2021-09-19 DIAGNOSIS — C220 Liver cell carcinoma: Secondary | ICD-10-CM | POA: Diagnosis not present

## 2021-09-19 DIAGNOSIS — N186 End stage renal disease: Secondary | ICD-10-CM | POA: Diagnosis not present

## 2021-09-19 HISTORY — PX: IR RADIOLOGIST EVAL & MGMT: IMG5224

## 2021-09-19 NOTE — Consult Note (Signed)
Chief Complaint: Patient was seen in consultation today for hepatocellular cancer at the request of Thayil,Irene T  Referring Physician(s): Thayil,Irene T  History of Present Illness: Rose Cruz is a 88 y.o. female with a recent diagnosis of well differentiated hepatocellular carcinoma.  Her mass is quite large at 9.9 x 6.0 x 8.3 cm.  Unfortunately, she also has end-stage renal failure with creatinines in the 5-5.3 range.  This has been progressive and is thought to be due to long-term metformin use.  She is followed by nephrology and has had the difficult conversation about considering dialysis.  She has deferred dialysis and has no intention of undergoing dialysis in the future.  She reports that she is fatigued and has occasional aches in her right flank region.  The fatigue is slowly progressive but she is able to perform most of her activities of daily living.  She is not bedbound.  Past Medical History:  Diagnosis Date   CKD (chronic kidney disease), stage V ([place])    Diabetes mellitus without complication ([place])    Hypertension    Stroke [facility])     Past Surgical History:  Procedure Laterality Date   COLON SURGERY     polypectomy 1004   EYE SURGERY     cataract   FRACTURE SURGERY     SPINE SURGERY     Dr Carloyn Manner 2006   TUBAL LIGATION      Allergies: Penicillins and Statins  Medications: Prior to Admission medications   Medication Sig Start Date End Date Taking? Authorizing Provider  amLODipine (NORVASC) 5 MG tablet Take 1 tablet (5 mg total) by mouth daily. Patient taking differently: Take 5 mg by mouth 2 (two) times daily. 01/25/17   McVey, Gelene Mink, PA-C  aspirin 81 MG tablet Take 81 mg by mouth 2 (two) times daily.    [provider]  atorvastatin (LIPITOR) 40 MG tablet Take 40 mg by mouth daily. 1/2 tablet evenings    [provider]  brimonidine (ALPHAGAN) 0.2 % ophthalmic solution Place 1 drop into the right eye 2 times daily.  10/25/20   [provider]  cloNIDine (CATAPRES) 0.1 MG tablet Take 0.1 mg by mouth 2 (two) times daily.    [provider]  dorzolamide-timolol (COSOPT) 22.3-6.8 MG/ML ophthalmic solution  11/27/20   [provider]  Ferrous Sulfate Dried 200 (65 FE) MG TABS Take by mouth.    [provider]  glimepiride (AMARYL) 2 MG tablet Take 2 mg by mouth daily with breakfast. Takes 1/2 tablet    [provider]  hydrALAZINE (APRESOLINE) 25 MG tablet Take 25 mg by mouth 2 (two) times daily.    [provider]  losartan-hydrochlorothiazide (HYZAAR) 100-25 MG tablet Take 1 tablet by mouth daily. 05/31/16   Harrison Mons, PA  Multiple Vitamin (MULTIVITAMIN) tablet Take 1 tablet by mouth daily.    [provider]     Family History  Problem Relation Age of Onset   Cancer - Other Mother    Cancer Father    Liver cancer Sister    Cancer - Lung Brother        smoker   Kidney cancer Son     Social History   Socioeconomic History   Marital status: Widowed    Spouse name: Not on file   Number of children: Not on file   Years of education: Not on file   Highest education level: Not on file  Occupational History  Occupation: Health and safety inspector: [employer]    Comment: works one morning a week  Tobacco Use   Smoking status: Never   Smokeless tobacco: Never  Vaping Use   Vaping Use: Never used  Substance and Sexual Activity   Alcohol use: No   Drug use: No   Sexual activity: Never  Other Topics Concern   Not on file  Social History Narrative   Seldom exercises.   Social Determinants of Health   Financial Resource Strain: Not on file  Food Insecurity: Not on file  Transportation Needs: Not on file  Physical Activity: Not on file  Stress: Not on file  Social Connections: Not on file    ECOG Status: 2 - Symptomatic, <50% confined to bed  Review of Systems: A 12 point ROS discussed and pertinent positives are indicated  in the HPI above.  All other systems are negative.  Review of Systems  Vital Signs: BP (!) 177/55 (BP Location: Right Arm)   Pulse 74   SpO2 98%   Physical Exam Constitutional:      Appearance: Normal appearance.  HENT:     Head: Normocephalic and atraumatic.  Eyes:     General: No scleral icterus. Cardiovascular:     Rate and Rhythm: Normal rate.  Pulmonary:     Effort: Pulmonary effort is normal.  Abdominal:     General: Abdomen is flat.     Palpations: Abdomen is soft.  Skin:    General: Skin is warm and dry.  Neurological:     Mental Status: She is alert and oriented to person, place, and time.  Psychiatric:        Behavior: Behavior normal.      Imaging: MR ABDOMEN WWO CONTRAST  Result Date: 08/27/2021 CLINICAL DATA:  Further evaluation of mass seen in the right lobe of the liver. EXAM: MRI ABDOMEN WITHOUT AND WITH CONTRAST TECHNIQUE: Multiplanar multisequence MR imaging of the abdomen was performed both before and after the administration of intravenous contrast. CONTRAST:  29m MULTIHANCE GADOBENATE DIMEGLUMINE 529 MG/ML IV SOLN COMPARISON:  Ultrasound Aug 16, 2021 FINDINGS: Lower chest: Limited MR evaluation reveals no acute abnormality. Hepatobiliary: Arterially enhancing lesion in the central right hepatic lobe measures 9.9 x 6.0 x 8.3 cm on images 36/11 and 36/17, the lesion demonstrates washout, some central necrosis and probable enhancing capsule. There is effacement of the vascular structures at the hepatic hilum without discrete portal venous tumoral invasion. Small lesion in the peripheral right lobe of the liver just superior to the above lesion which measures 15 mm on image 19/3 and demonstrates similar signal characteristics. Gallbladder is unremarkable.  No biliary ductal dilation. Pancreas: No pancreatic ductal dilation or evidence of acute inflammation. Pancreatic atrophy. There are multiple scattered well-circumscribed T2 hyperintense pancreatic lesions  measuring up to 6 mm in the body of the pancreas on image 18/4, none of which demonstrate suspicious postcontrast enhancement or wall thickening. Spleen:  No splenomegaly or focal splenic lesion. Adrenals/Urinary Tract: Bilateral adrenal glands appear normal. No hydronephrosis. No suspicious renal lesion. Slight atrophy of the left kidney with lobular renal contour likely reflecting scarring. Stomach/Bowel: Visualized portions within the abdomen are unremarkable. Vascular/Lymphatic: Normal caliber abdominal aorta. The portal, splenic and superior mesenteric veins are patent. No pathologically enlarged abdominal lymph nodes. Other:  No significant abdominal free fluid. Musculoskeletal: No suspicious bone lesions identified. IMPRESSION: 1. Arterially enhancing 9.9 x 6.0 x 8.3 cm lesion in the central right hepatic lobe demonstrates imaging characteristics  most consistent with hepatocellular carcinoma, consider definitive characterization with direct tissue sampling. 2. Small lesion in the peripheral right lobe of the liver just superior to the above lesion, likely reflects a small metastasis. 3. No abdominal adenopathy. 4. Multiple scattered well-circumscribed T2 hyperintense pancreatic lesions measuring up to 6 mm in the body of the pancreas, none of which demonstrate suspicious MRI features and are favored to reflect side branch IPMNs. This potential oncologic patient would suggest attention on follow-up imaging. These results will be called to the ordering clinician or representative by the Radiologist Assistant, and communication documented in the PACS or [employer]. Electronically Signed   By: Dahlia Bailiff M.D.   On: 08/27/2021 16:34   US BIOPSY (LIVER)  Result Date: 09/06/2021 INDICATION: Liver mass.  No diagnosis. EXAM: ULTRASOUND GUIDED LIVER LESION BIOPSY COMPARISON:  US Abdomen, 08/16/2021.  MRI abdomen, 08/24/2021 MEDICATIONS: None ANESTHESIA/SEDATION: Local anesthetic was administered. The  patient was continuously monitored during the procedure by the interventional radiology nurse under my direct supervision. COMPLICATIONS: None immediate. PROCEDURE: Informed written consent was obtained from the patient and/or patient's representative after a discussion of the risks, benefits and alternatives to treatment. The patient understands and consents the procedure. A timeout was performed prior to the initiation of the procedure. Ultrasound scanning was performed of the right upper abdominal quadrant demonstrates a large RIGHT hepatic lobe mass measuring approximately 8.3 x 7.0 x 7.3 cm. The RIGHT hepatic lobe mass was selected for biopsy and the procedure was planned. The right upper abdominal quadrant was prepped and draped in the usual sterile fashion. The overlying soft tissues were anesthetized with 1% lidocaine with epinephrine. A 17 gauge, 6.8 cm co-axial needle was advanced into a peripheral aspect of the lesion. This was followed by 3 core biopsies with an 18 gauge core device under direct ultrasound guidance. The coaxial needle tract was embolized with a small amount of Gel-Foam slurry and superficial hemostasis was obtained with manual compression. Post procedural scanning was negative for definitive area of hemorrhage or additional complication. A dressing was placed. The patient tolerated the procedure well without immediate post procedural complication. IMPRESSION: Successful ultrasound guided targeted biopsy of RIGHT hepatic lobe mass. Michaelle Birks, MD [facility] Electronically Signed   By: Michaelle Birks M.D.   On: 09/06/2021 14:52    Labs:  CBC: Recent Labs    09/06/21 1130 09/17/21 1437  WBC 5.4 5.8  HGB 8.2* 8.9*  HCT 26.1* 27.5*  PLT 210 259    COAGS: Recent Labs    09/06/21 1130  INR 1.0    BMP: Recent Labs    09/06/21 1130 09/17/21 1437  NA 140 139  K 5.5* 5.0  CL 111 107  CO2 19* 23  GLUCOSE 94 91   BUN 86* 65*  CALCIUM 8.7* 9.1  CREATININE 5.30* 5.23*  GFRNONAA 7* 8*    LIVER FUNCTION TESTS: Recent Labs    09/06/21 1130 09/17/21 1437  BILITOT 0.5 0.3  AST 76* 71*  ALT 96* 97*  ALKPHOS 182* 217*  PROT 6.5 6.3*  ALBUMIN 3.5 3.7    TUMOR MARKERS: No results for input(s): AFPTM, CEA, CA199, CHROMGRNA in the last 8760 hours.  Assessment and Plan:  Unfortunate 88 year old female with a large biopsy-proven well differentiated hepatocellular carcinoma.  Unfortunately, her options for liver directed therapy are minimal.  The lesion is too large for percutaneous ablation using microwave ablation.  Her advanced renal failure is a contraindication to transarterial  embolotherapy such as chemoembolization or radioembolization with Y90.  Currently, her best option will likely be immunotherapy.  We will defer to Dr. Lorenso Courier and his team to determine the optimal first-line treatment.  We are in the process of acquiring the Galvanize pulsed Equities trader for the [facility] system.  This is a Field seismologist that shows some promise in conjunction with concurrent immunotherapy.  If we are able to obtain this device, she may be a candidate for this adjunctive treatment.  1.)  I will ask my staff to send me reminders every 6 weeks to check in on how Mrs. Seckinger's therapy is going and check on the availability of the Galvanize device.  Thank you for this interesting consult.  I greatly enjoyed meeting MCKENSEY BERGHUIS and look forward to participating in their care.  A copy of this report was sent to the requesting provider on this date.  Electronically Signed: Criselda Peaches 09/19/2021, 3:35 PM   I spent a total of  30 Minutes  in face to face in clinical consultation, greater than 50% of which was counseling/coordinating care for

## 2021-09-20 ENCOUNTER — Telehealth: Payer: Self-pay | Admitting: Hematology and Oncology

## 2021-09-20 ENCOUNTER — Telehealth: Payer: Self-pay | Admitting: Genetic Counselor

## 2021-09-20 ENCOUNTER — Telehealth: Payer: Self-pay | Admitting: Physician Assistant

## 2021-09-20 DIAGNOSIS — N185 Chronic kidney disease, stage 5: Secondary | ICD-10-CM | POA: Diagnosis not present

## 2021-09-20 NOTE — Telephone Encounter (Signed)
Scheduled per 6/8 secure chat with Murray Hodgkins, message has been left with pt

## 2021-09-20 NOTE — Telephone Encounter (Signed)
.  Called pt per 6/8inbasket , Patient was unavailable, a message with appt time and date was left with number on file.   

## 2021-09-20 NOTE — Telephone Encounter (Signed)
I spoke to Ms. Rose Cruz after her consultation with IR. Plan will be to move forward with systemic therapy with immunotherapy. Once we obtain insurance authorization, we will schedule appts. Tentatively plan to start treatment in the next 1-2 weeks. Patient expressed understanding of the plan provided.

## 2021-09-24 ENCOUNTER — Other Ambulatory Visit: Payer: Self-pay | Admitting: Hematology and Oncology

## 2021-09-24 NOTE — Progress Notes (Signed)
START OFF PATHWAY REGIMEN - Other   [id number]:Durvalumab 1,500 mg IV D1 q28 Days:   A cycle is every 28 days:     Durvalumab   **Always confirm dose/schedule in your pharmacy ordering system**  Patient Characteristics: Intent of Therapy: Non-Curative / Palliative Intent, Discussed with Patient

## 2021-09-25 ENCOUNTER — Inpatient Hospital Stay: Payer: Medicare Other

## 2021-09-25 ENCOUNTER — Other Ambulatory Visit: Payer: Self-pay

## 2021-09-25 DIAGNOSIS — E1122 Type 2 diabetes mellitus with diabetic chronic kidney disease: Secondary | ICD-10-CM | POA: Diagnosis not present

## 2021-09-25 DIAGNOSIS — I120 Hypertensive chronic kidney disease with stage 5 chronic kidney disease or end stage renal disease: Secondary | ICD-10-CM | POA: Diagnosis not present

## 2021-09-25 DIAGNOSIS — N185 Chronic kidney disease, stage 5: Secondary | ICD-10-CM | POA: Diagnosis not present

## 2021-09-25 DIAGNOSIS — R160 Hepatomegaly, not elsewhere classified: Secondary | ICD-10-CM | POA: Diagnosis not present

## 2021-09-25 DIAGNOSIS — E875 Hyperkalemia: Secondary | ICD-10-CM | POA: Diagnosis not present

## 2021-09-25 DIAGNOSIS — C220 Liver cell carcinoma: Secondary | ICD-10-CM | POA: Diagnosis not present

## 2021-09-26 ENCOUNTER — Encounter: Payer: Self-pay | Admitting: Hematology and Oncology

## 2021-09-27 ENCOUNTER — Other Ambulatory Visit: Payer: Medicare Other

## 2021-09-27 ENCOUNTER — Ambulatory Visit: Payer: Medicare Other | Admitting: Hematology and Oncology

## 2021-09-27 ENCOUNTER — Ambulatory Visit: Payer: Medicare Other

## 2021-09-28 ENCOUNTER — Inpatient Hospital Stay: Payer: Medicare Other

## 2021-09-28 ENCOUNTER — Other Ambulatory Visit: Payer: Self-pay

## 2021-09-28 ENCOUNTER — Other Ambulatory Visit: Payer: Self-pay | Admitting: Hematology and Oncology

## 2021-09-28 ENCOUNTER — Telehealth: Payer: Self-pay | Admitting: Hematology and Oncology

## 2021-09-28 ENCOUNTER — Inpatient Hospital Stay (HOSPITAL_BASED_OUTPATIENT_CLINIC_OR_DEPARTMENT_OTHER): Payer: Medicare Other | Admitting: Hematology and Oncology

## 2021-09-28 VITALS — BP 186/61 | HR 69 | Temp 97.2°F | Resp 17 | Wt 139.1 lb

## 2021-09-28 VITALS — BP 180/59 | HR 67 | Temp 97.7°F | Resp 18

## 2021-09-28 DIAGNOSIS — C220 Liver cell carcinoma: Secondary | ICD-10-CM

## 2021-09-28 DIAGNOSIS — I120 Hypertensive chronic kidney disease with stage 5 chronic kidney disease or end stage renal disease: Secondary | ICD-10-CM | POA: Diagnosis not present

## 2021-09-28 DIAGNOSIS — D509 Iron deficiency anemia, unspecified: Secondary | ICD-10-CM | POA: Diagnosis not present

## 2021-09-28 DIAGNOSIS — N185 Chronic kidney disease, stage 5: Secondary | ICD-10-CM | POA: Diagnosis not present

## 2021-09-28 DIAGNOSIS — E119 Type 2 diabetes mellitus without complications: Secondary | ICD-10-CM | POA: Diagnosis not present

## 2021-09-28 DIAGNOSIS — E1122 Type 2 diabetes mellitus with diabetic chronic kidney disease: Secondary | ICD-10-CM | POA: Diagnosis not present

## 2021-09-28 LAB — CMP (CANCER CENTER ONLY)
ALT: 116 U/L — ABNORMAL HIGH (ref 0–44)
AST: 86 U/L — ABNORMAL HIGH (ref 15–41)
Albumin: 3.5 g/dL (ref 3.5–5.0)
Alkaline Phosphatase: 191 U/L — ABNORMAL HIGH (ref 38–126)
Anion gap: 9 (ref 5–15)
BUN: 81 mg/dL — ABNORMAL HIGH (ref 8–23)
CO2: 19 mmol/L — ABNORMAL LOW (ref 22–32)
Calcium: 9 mg/dL (ref 8.9–10.3)
Chloride: 110 mmol/L (ref 98–111)
Creatinine: 5.33 mg/dL (ref 0.44–1.00)
GFR, Estimated: 7 mL/min — ABNORMAL LOW (ref >60)
Glucose, Bld: 94 mg/dL (ref 70–99)
Potassium: 5.4 mmol/L — ABNORMAL HIGH (ref 3.5–5.1)
Sodium: 138 mmol/L (ref 135–145)
Total Bilirubin: 0.3 mg/dL (ref 0.3–1.2)
Total Protein: 6.1 g/dL — ABNORMAL LOW (ref 6.5–8.1)

## 2021-09-28 LAB — CBC WITH DIFFERENTIAL (CANCER CENTER ONLY)
Abs Immature Granulocytes: 0.02 10*3/uL (ref 0.00–0.07)
Basophils Absolute: 0 10*3/uL (ref 0.0–0.1)
Basophils Relative: 1 %
Eosinophils Absolute: 0.5 10*3/uL (ref 0.0–0.5)
Eosinophils Relative: 8 %
HCT: 24.4 % — ABNORMAL LOW (ref 36.0–46.0)
Hemoglobin: 8 g/dL — ABNORMAL LOW (ref 12.0–15.0)
Immature Granulocytes: 0 %
Lymphocytes Relative: 21 %
Lymphs Abs: 1.2 10*3/uL (ref 0.7–4.0)
MCH: 30 pg (ref 26.0–34.0)
MCHC: 32.8 g/dL (ref 30.0–36.0)
MCV: 91.4 fL (ref 80.0–100.0)
Monocytes Absolute: 0.5 10*3/uL (ref 0.1–1.0)
Monocytes Relative: 9 %
Neutro Abs: 3.6 10*3/uL (ref 1.7–7.7)
Neutrophils Relative %: 61 %
Platelet Count: 200 10*3/uL (ref 150–400)
RBC: 2.67 MIL/uL — ABNORMAL LOW (ref 3.87–5.11)
RDW: 14.3 % (ref 11.5–15.5)
WBC Count: 5.9 10*3/uL (ref 4.0–10.5)
nRBC: 0 % (ref 0.0–0.2)

## 2021-09-28 MED ORDER — SODIUM CHLORIDE 0.9 % IV SOLN: Freq: Once | INTRAVENOUS | Status: AC

## 2021-09-28 MED ORDER — SODIUM CHLORIDE 0.9 % IV SOLN
1500.0000 mg | Freq: Once | INTRAVENOUS | Status: AC
  Administered 2021-09-28: 1500 mg via INTRAVENOUS
  Filled 2021-09-28: qty 30

## 2021-09-28 NOTE — Progress Notes (Unsigned)
CRITICAL VALUE STICKER  CRITICAL VALUE: Scr- 5.33  RECEIVER (on-site recipient of call): Shonna Chock, RN   [place] NOTIFIED: 09/28/21 1042a  MESSENGER (representative from lab): Hillary  MD NOTIFIED: Dr.Dorsey  TIME OF NOTIFICATION: 09/28/21 1044a  RESPONSE:  this is pt baseline, MD made aware, face to face, no action needed

## 2021-09-28 NOTE — Telephone Encounter (Signed)
Per 6/16 los called and spoke to pot about upcoming appointments.  Pt confirmed appointments

## 2021-09-28 NOTE — Patient Instructions (Signed)
[facility] MEDICAL ONCOLOGY  Discharge Instructions: Thank you for choosing [facility] to provide your oncology and hematology care.   If you have a lab appointment with the Cancer Center, please go directly to the Cancer Center and check in at the registration area.   Wear comfortable clothing and clothing appropriate for easy access to any Portacath or PICC line.   We strive to give you quality time with your provider. You may need to reschedule your appointment if you arrive late (15 or more minutes).  Arriving late affects you and other patients whose appointments are after yours.  Also, if you miss three or more appointments without notifying the office, you may be dismissed from the clinic at the provider's discretion.      For prescription refill requests, have your pharmacy contact our office and allow 72 hours for refills to be completed.    Today you received the following chemotherapy and/or immunotherapy agents: Durvalumab      To help prevent nausea and vomiting after your treatment, we encourage you to take your nausea medication as directed.  BELOW ARE SYMPTOMS THAT SHOULD BE REPORTED IMMEDIATELY: *FEVER GREATER THAN 100.4 F (38 C) OR HIGHER *CHILLS OR SWEATING *NAUSEA AND VOMITING THAT IS NOT CONTROLLED WITH YOUR NAUSEA MEDICATION *UNUSUAL SHORTNESS OF BREATH *UNUSUAL BRUISING OR BLEEDING *URINARY PROBLEMS (pain or burning when urinating, or frequent urination) *BOWEL PROBLEMS (unusual diarrhea, constipation, pain near the anus) TENDERNESS IN MOUTH AND THROAT WITH OR WITHOUT PRESENCE OF ULCERS (sore throat, sores in mouth, or a toothache) UNUSUAL RASH, SWELLING OR PAIN  UNUSUAL VAGINAL DISCHARGE OR ITCHING   Items with * indicate a potential emergency and should be followed up as soon as possible or go to the Emergency Department if any problems should occur.  Please show the CHEMOTHERAPY ALERT CARD or IMMUNOTHERAPY ALERT CARD at check-in to  the Emergency Department and triage nurse.  Should you have questions after your visit or need to cancel or reschedule your appointment, please contact [facility]  Dept: [phone]  and follow the prompts.  Office hours are 8:00 a.m. to 4:30 p.m. Monday - Friday. Please note that voicemails left after 4:00 p.m. may not be returned until the following business day.  We are closed weekends and major holidays. You have access to a nurse at all times for urgent questions. Please call the main number to the clinic Dept: [phone] and follow the prompts.   For any non-urgent questions, you may also contact your provider using MyChart. We now offer e-Visits for anyone 18 and older to request care online for non-urgent symptoms. For details visit [email or web address].   Also download the MyChart app! Go to the app store, search "MyChart", open the app, select [facility], and log in with your MyChart username and password.  Masks are optional in the cancer centers. If you would like for your care team to wear a mask while they are taking care of you, please let them know. For doctor visits, patients may have with them one support person who is at least 86 years old. At this time, visitors are not allowed in the infusion area. Durvalumab injection What is this medication? DURVALUMAB (dur VAL ue mab) is a monoclonal antibody. It is used to treat lung cancer. This medicine may be used for other purposes; ask your health care provider or pharmacist if you have questions. COMMON BRAND NAME(S): IMFINZI What should I tell my care   team before I take this medication? They need to know if you have any of these conditions: autoimmune diseases like Crohn's disease, ulcerative colitis, or lupus have had or planning to have an allogeneic stem cell transplant (uses someone else's stem cells) history of organ transplant history of radiation to the chest nervous system problems  like myasthenia gravis or Guillain-Barre syndrome an unusual or allergic reaction to durvalumab, other medicines, foods, dyes, or preservatives pregnant or trying to get pregnant breast-feeding How should I use this medication? This medicine is for infusion into a vein. It is given by a health care professional in a hospital or clinic setting. A special MedGuide will be given to you before each treatment. Be sure to read this information carefully each time. Talk to your pediatrician regarding the use of this medicine in children. Special care may be needed. Overdosage: If you think you have taken too much of this medicine contact a poison control center or emergency room at once. NOTE: This medicine is only for you. Do not share this medicine with others. What if I miss a dose? It is important not to miss your dose. Call your doctor or health care professional if you are unable to keep an appointment. What may interact with this medication? Interactions have not been studied. This list may not describe all possible interactions. Give your health care provider a list of all the medicines, herbs, non-prescription drugs, or dietary supplements you use. Also tell them if you smoke, drink alcohol, or use illegal drugs. Some items may interact with your medicine. What should I watch for while using this medication? This medication may make you feel generally unwell. Continue your course of treatment even though you feel ill unless your care team tells you to stop. You may need blood work done while you are taking this medication. Do not become pregnant while taking this medication or for 3 months after stopping it. Women should inform their care team if they wish to become pregnant or think they might be pregnant. There is a potential for serious side effects to an unborn child. Talk to your care team or pharmacist for more information. Do not breast-feed an infant while taking this medication or for 3  months after stopping it. What side effects may I notice from receiving this medication? Side effects that you should report to your care team as soon as possible: Allergic reactions--skin rash, itching, hives, swelling of the face, lips, tongue, or throat Bloody or watery diarrhea Dizziness, loss of balance or coordination, confusion or trouble speaking Dry cough, shortness of breath or trouble breathing Flushing, mostly over the face, neck, and chest, during injection High blood sugar (hyperglycemia)--increased thirst or amount of urine, unusual weakness or fatigue, blurry vision High thyroid levels (hyperthyroidism)--fast or irregular heartbeat, weight loss, excessive sweating or sensitivity to heat, tremors or shaking, anxiety, nervousness, irregular menstrual cycle or spotting Infection--fever, chills, cough, or sore throat Liver injury--right upper belly pain, loss of appetite, nausea, light-colored stool, dark yellow or brown urine, yellowing skin or eyes, unusual weakness or fatigue Low adrenal gland function--nausea, vomiting, loss of appetite, unusual weakness or fatigue, dizziness, low blood pressure Low thyroid levels (hypothyroidism)--unusual weakness or fatigue, increased sensitivity to cold, constipation, hair loss, dry skin, weight gain, feelings of depression Pancreatitis--severe stomach pain that spreads to your back or gets worse after eating or when touched, fever, nausea, vomiting Rash, fever, and swollen lymph nodes Redness, blistering, peeling or loosening of the skin, including inside the   mouth Wheezing--trouble breathing with loud or whistling sounds Side effects that usually do not require medical attention (report these to your care team if they continue or are bothersome): Fatigue Hair loss This list may not describe all possible side effects. Call your doctor for medical advice about side effects. You may report side effects to FDA at 1-800-FDA-1088. Where should I  keep my medication? This medication is given in a hospital or clinic. It will not be stored at home. NOTE: This sheet is a summary. It may not cover all possible information. If you have questions about this medicine, talk to your doctor, pharmacist, or health care provider.  2023 Elsevier/Gold Standard (2021-03-02 00:00:00) 

## 2021-09-28 NOTE — Progress Notes (Signed)
Per Lorenso Courier MD, ok to treat today with SCR 5.33 and AST & ALT of 86 and 116. Also ok to treat with BP of 183/49

## 2021-10-01 ENCOUNTER — Telehealth: Payer: Self-pay | Admitting: *Deleted

## 2021-10-01 NOTE — Telephone Encounter (Signed)
-----   Message from Rafael Bihari, RN sent at 09/28/2021  1:35 PM EDT ----- Regarding: Dr. Lorenso Courier Pt, first time Imfinzi Dr Lorenso Courier pt, came in 6/16 for first time Imfinzi infusion. Tolerated infusion with no issues. Needs call back.

## 2021-10-10 ENCOUNTER — Telehealth: Payer: Self-pay | Admitting: *Deleted

## 2021-10-10 NOTE — Telephone Encounter (Signed)
Received vm message from pt regarding getting her scheduled eye injection tomorrow at the [facility]. Discussed with Dr. Lorenso Courier. He states it is ok for her to get this injection as she is on immunotherapy, not chemo. Last platelet count was 86k which Dr. Lorenso Courier said was acceptable for injection. TCT patient and spoke with her. Gave her Dr. Libby Maw recommendations. She voiced understanding and will plan to go to her eye appt tomorrow as scheduled.

## 2021-10-11 ENCOUNTER — Encounter (INDEPENDENT_AMBULATORY_CARE_PROVIDER_SITE_OTHER): Payer: Medicare Other | Admitting: Ophthalmology

## 2021-10-11 DIAGNOSIS — H43813 Vitreous degeneration, bilateral: Secondary | ICD-10-CM

## 2021-10-11 DIAGNOSIS — I10 Essential (primary) hypertension: Secondary | ICD-10-CM

## 2021-10-11 DIAGNOSIS — E113513 Type 2 diabetes mellitus with proliferative diabetic retinopathy with macular edema, bilateral: Secondary | ICD-10-CM | POA: Diagnosis not present

## 2021-10-11 DIAGNOSIS — H35033 Hypertensive retinopathy, bilateral: Secondary | ICD-10-CM

## 2021-10-12 ENCOUNTER — Inpatient Hospital Stay: Payer: Medicare Other

## 2021-10-12 ENCOUNTER — Telehealth: Payer: Self-pay | Admitting: *Deleted

## 2021-10-12 ENCOUNTER — Other Ambulatory Visit: Payer: Self-pay

## 2021-10-12 DIAGNOSIS — D509 Iron deficiency anemia, unspecified: Secondary | ICD-10-CM | POA: Diagnosis not present

## 2021-10-12 DIAGNOSIS — I120 Hypertensive chronic kidney disease with stage 5 chronic kidney disease or end stage renal disease: Secondary | ICD-10-CM | POA: Diagnosis not present

## 2021-10-12 DIAGNOSIS — C220 Liver cell carcinoma: Secondary | ICD-10-CM

## 2021-10-12 DIAGNOSIS — E1122 Type 2 diabetes mellitus with diabetic chronic kidney disease: Secondary | ICD-10-CM | POA: Diagnosis not present

## 2021-10-12 DIAGNOSIS — E119 Type 2 diabetes mellitus without complications: Secondary | ICD-10-CM | POA: Diagnosis not present

## 2021-10-12 DIAGNOSIS — N185 Chronic kidney disease, stage 5: Secondary | ICD-10-CM | POA: Diagnosis not present

## 2021-10-12 LAB — CBC WITH DIFFERENTIAL (CANCER CENTER ONLY)
Abs Immature Granulocytes: 0.03 10*3/uL (ref 0.00–0.07)
Basophils Absolute: 0 10*3/uL (ref 0.0–0.1)
Basophils Relative: 1 %
Eosinophils Absolute: 0.4 10*3/uL (ref 0.0–0.5)
Eosinophils Relative: 7 %
HCT: 24.5 % — ABNORMAL LOW (ref 36.0–46.0)
Hemoglobin: 8.1 g/dL — ABNORMAL LOW (ref 12.0–15.0)
Immature Granulocytes: 1 %
Lymphocytes Relative: 20 %
Lymphs Abs: 1 10*3/uL (ref 0.7–4.0)
MCH: 30 pg (ref 26.0–34.0)
MCHC: 33.1 g/dL (ref 30.0–36.0)
MCV: 90.7 fL (ref 80.0–100.0)
Monocytes Absolute: 0.4 10*3/uL (ref 0.1–1.0)
Monocytes Relative: 9 %
Neutro Abs: 3.1 10*3/uL (ref 1.7–7.7)
Neutrophils Relative %: 62 %
Platelet Count: 220 10*3/uL (ref 150–400)
RBC: 2.7 MIL/uL — ABNORMAL LOW (ref 3.87–5.11)
RDW: 13.8 % (ref 11.5–15.5)
WBC Count: 5 10*3/uL (ref 4.0–10.5)
nRBC: 0 % (ref 0.0–0.2)

## 2021-10-12 LAB — CMP (CANCER CENTER ONLY)
ALT: 114 U/L — ABNORMAL HIGH (ref 0–44)
AST: 76 U/L — ABNORMAL HIGH (ref 15–41)
Albumin: 3.5 g/dL (ref 3.5–5.0)
Alkaline Phosphatase: 196 U/L — ABNORMAL HIGH (ref 38–126)
Anion gap: 8 (ref 5–15)
BUN: 64 mg/dL — ABNORMAL HIGH (ref 8–23)
CO2: 22 mmol/L (ref 22–32)
Calcium: 8.7 mg/dL — ABNORMAL LOW (ref 8.9–10.3)
Chloride: 105 mmol/L (ref 98–111)
Creatinine: 4.79 mg/dL (ref 0.44–1.00)
GFR, Estimated: 8 mL/min — ABNORMAL LOW (ref >60)
Glucose, Bld: 99 mg/dL (ref 70–99)
Potassium: 5 mmol/L (ref 3.5–5.1)
Sodium: 135 mmol/L (ref 135–145)
Total Bilirubin: 0.3 mg/dL (ref 0.3–1.2)
Total Protein: 6.2 g/dL — ABNORMAL LOW (ref 6.5–8.1)

## 2021-10-12 LAB — LACTATE DEHYDROGENASE: LDH: 228 U/L — ABNORMAL HIGH (ref 98–192)

## 2021-10-12 NOTE — Telephone Encounter (Signed)
CRITICAL VALUE STICKER  CRITICAL VALUE: Creatinine  RECEIVER (on-site recipient of call): Drucie Ip, RN  DATE & TIME NOTIFIED: 10/12/21 @ 1041  MESSENGER (representative from lab): Lauren  MD NOTIFIED: Dede Query, PA  TIME OF NOTIFICATION: 10 RESPONSE:  Dede Query, PA aware.  This level is improved over previous Creatinine.

## 2021-10-13 LAB — AFP TUMOR MARKER: AFP, Serum, Tumor Marker: 488 ng/mL — ABNORMAL HIGH (ref 0.0–8.7)

## 2021-10-18 DIAGNOSIS — N185 Chronic kidney disease, stage 5: Secondary | ICD-10-CM | POA: Diagnosis not present

## 2021-10-24 DIAGNOSIS — N185 Chronic kidney disease, stage 5: Secondary | ICD-10-CM | POA: Diagnosis not present

## 2021-10-24 DIAGNOSIS — I120 Hypertensive chronic kidney disease with stage 5 chronic kidney disease or end stage renal disease: Secondary | ICD-10-CM | POA: Diagnosis not present

## 2021-10-24 DIAGNOSIS — M899 Disorder of bone, unspecified: Secondary | ICD-10-CM | POA: Diagnosis not present

## 2021-10-24 DIAGNOSIS — D631 Anemia in chronic kidney disease: Secondary | ICD-10-CM | POA: Diagnosis not present

## 2021-10-24 DIAGNOSIS — E1122 Type 2 diabetes mellitus with diabetic chronic kidney disease: Secondary | ICD-10-CM | POA: Diagnosis not present

## 2021-10-24 DIAGNOSIS — N189 Chronic kidney disease, unspecified: Secondary | ICD-10-CM | POA: Diagnosis not present

## 2021-10-25 ENCOUNTER — Encounter: Payer: Self-pay | Admitting: Genetic Counselor

## 2021-10-25 ENCOUNTER — Inpatient Hospital Stay: Payer: Medicare Other

## 2021-10-25 ENCOUNTER — Other Ambulatory Visit: Payer: Self-pay

## 2021-10-25 ENCOUNTER — Telehealth: Payer: Self-pay | Admitting: *Deleted

## 2021-10-25 ENCOUNTER — Other Ambulatory Visit: Payer: Self-pay | Admitting: Genetic Counselor

## 2021-10-25 ENCOUNTER — Inpatient Hospital Stay: Payer: Medicare Other | Attending: Physician Assistant | Admitting: Genetic Counselor

## 2021-10-25 DIAGNOSIS — Z800 Family history of malignant neoplasm of digestive organs: Secondary | ICD-10-CM

## 2021-10-25 DIAGNOSIS — C220 Liver cell carcinoma: Secondary | ICD-10-CM

## 2021-10-25 DIAGNOSIS — Z5112 Encounter for antineoplastic immunotherapy: Secondary | ICD-10-CM | POA: Insufficient documentation

## 2021-10-25 DIAGNOSIS — Z8051 Family history of malignant neoplasm of kidney: Secondary | ICD-10-CM | POA: Diagnosis not present

## 2021-10-25 DIAGNOSIS — Z79899 Other long term (current) drug therapy: Secondary | ICD-10-CM | POA: Insufficient documentation

## 2021-10-25 LAB — CMP (CANCER CENTER ONLY)
ALT: 156 U/L — ABNORMAL HIGH (ref 0–44)
AST: 125 U/L — ABNORMAL HIGH (ref 15–41)
Albumin: 3.7 g/dL (ref 3.5–5.0)
Alkaline Phosphatase: 199 U/L — ABNORMAL HIGH (ref 38–126)
Anion gap: 8 (ref 5–15)
BUN: 84 mg/dL — ABNORMAL HIGH (ref 8–23)
CO2: 20 mmol/L — ABNORMAL LOW (ref 22–32)
Calcium: 8.7 mg/dL — ABNORMAL LOW (ref 8.9–10.3)
Chloride: 104 mmol/L (ref 98–111)
Creatinine: 5.16 mg/dL (ref 0.44–1.00)
GFR, Estimated: 8 mL/min — ABNORMAL LOW (ref >60)
Glucose, Bld: 98 mg/dL (ref 70–99)
Potassium: 5.9 mmol/L — ABNORMAL HIGH (ref 3.5–5.1)
Sodium: 132 mmol/L — ABNORMAL LOW (ref 135–145)
Total Bilirubin: 0.3 mg/dL (ref 0.3–1.2)
Total Protein: 6.3 g/dL — ABNORMAL LOW (ref 6.5–8.1)

## 2021-10-25 LAB — CBC WITH DIFFERENTIAL (CANCER CENTER ONLY)
Abs Immature Granulocytes: 0.01 10*3/uL (ref 0.00–0.07)
Basophils Absolute: 0 10*3/uL (ref 0.0–0.1)
Basophils Relative: 0 %
Eosinophils Absolute: 0.4 10*3/uL (ref 0.0–0.5)
Eosinophils Relative: 8 %
HCT: 24 % — ABNORMAL LOW (ref 36.0–46.0)
Hemoglobin: 7.9 g/dL — ABNORMAL LOW (ref 12.0–15.0)
Immature Granulocytes: 0 %
Lymphocytes Relative: 25 %
Lymphs Abs: 1.3 10*3/uL (ref 0.7–4.0)
MCH: 30 pg (ref 26.0–34.0)
MCHC: 32.9 g/dL (ref 30.0–36.0)
MCV: 91.3 fL (ref 80.0–100.0)
Monocytes Absolute: 0.6 10*3/uL (ref 0.1–1.0)
Monocytes Relative: 10 %
Neutro Abs: 3.1 10*3/uL (ref 1.7–7.7)
Neutrophils Relative %: 57 %
Platelet Count: 223 10*3/uL (ref 150–400)
RBC: 2.63 MIL/uL — ABNORMAL LOW (ref 3.87–5.11)
RDW: 13.8 % (ref 11.5–15.5)
WBC Count: 5.4 10*3/uL (ref 4.0–10.5)
nRBC: 0 % (ref 0.0–0.2)

## 2021-10-25 LAB — LACTATE DEHYDROGENASE: LDH: 246 U/L — ABNORMAL HIGH (ref 98–192)

## 2021-10-25 LAB — GENETIC SCREENING ORDER

## 2021-10-25 NOTE — Telephone Encounter (Signed)
CRITICAL VALUE STICKER  CRITICAL VALUE: Creatinine 5.16  RECEIVER (on-site recipient of call): [place] NOTIFIED: 1440 10/25/21  MESSENGER (representative from lab): Lelan Pons  MD NOTIFIED: Dr. Lorenso Courier  TIME OF NOTIFICATION:1445  RESPONSE:  MD acknowledged receipt of labs and all were reviewed.

## 2021-10-26 ENCOUNTER — Inpatient Hospital Stay: Payer: Medicare Other

## 2021-10-26 ENCOUNTER — Encounter: Payer: Self-pay | Admitting: Genetic Counselor

## 2021-10-26 ENCOUNTER — Inpatient Hospital Stay (HOSPITAL_BASED_OUTPATIENT_CLINIC_OR_DEPARTMENT_OTHER): Payer: Medicare Other | Admitting: Hematology and Oncology

## 2021-10-26 VITALS — BP 145/47 | HR 68 | Temp 97.5°F | Resp 15 | Wt 138.7 lb

## 2021-10-26 DIAGNOSIS — Z800 Family history of malignant neoplasm of digestive organs: Secondary | ICD-10-CM | POA: Insufficient documentation

## 2021-10-26 DIAGNOSIS — Z8051 Family history of malignant neoplasm of kidney: Secondary | ICD-10-CM

## 2021-10-26 DIAGNOSIS — C220 Liver cell carcinoma: Secondary | ICD-10-CM

## 2021-10-26 DIAGNOSIS — Z79899 Other long term (current) drug therapy: Secondary | ICD-10-CM | POA: Diagnosis not present

## 2021-10-26 DIAGNOSIS — Z5112 Encounter for antineoplastic immunotherapy: Secondary | ICD-10-CM | POA: Diagnosis not present

## 2021-10-26 HISTORY — DX: Family history of malignant neoplasm of digestive organs: Z80.0

## 2021-10-26 HISTORY — DX: Family history of malignant neoplasm of kidney: Z80.51

## 2021-10-26 MED ORDER — SODIUM CHLORIDE 0.9 % IV SOLN
1500.0000 mg | Freq: Once | INTRAVENOUS | Status: AC
  Administered 2021-10-26: 1500 mg via INTRAVENOUS
  Filled 2021-10-26: qty 30

## 2021-10-26 MED ORDER — SODIUM CHLORIDE 0.9 % IV SOLN: Freq: Once | INTRAVENOUS | Status: AC

## 2021-10-26 NOTE — Progress Notes (Signed)
[place] [phone]   [phone]  PROGRESS NOTE  Patient Care Team: Leonides Sake, MD as PCP - General (Family Medicine) Orson Slick, MD as Consulting Physician (Oncology)  Hematological/Oncological History # Hepatocellular carcinoma 08/16/2021: Abdominal US: A solid mass in the right hepatic lobe measuring 7.8 x 5.6 x 7.5 cm.  08/24/2021: MRI abdomen: Arterially enhancing 9.9 x 6.0 x 8.3 cm lesion in the central right hepatic lobe demonstrates imaging characteristics most consistent with hepatocellular carcinoma. Small lesion in the peripheral right lobe of the liver just superior to the above lesion, likely reflects a small metastasis. No abdominal adenopathy. Multiple scattered well-circumscribed T2 hyperintense pancreatic lesions measuring up to 6 mm in the body of the pancreas, none of which demonstrate suspicious MRI features and are favored to reflect side branch IPMNs.  09/06/2021: Underwent US guided liver biopsy. Pathology confirmed well-differentiated hepatocellular carcinoma.  09/28/2021: Cycle 1 Day 1 of Durvalumab  10/26/2021:  Cycle 2 Day 1 of Durvalumab   Interval History:  Rose Cruz 88 y.o. female with medical history significant for cellular carcinoma who presents for a follow up visit. The patient's last visit was on 09/28/2021. In the interim since the last visit she completed Cycle 1 of monotherapy durvalumab.   On exam today Rose Cruz is accompanied by her son.  She reports she has been well overall interim since her last visit.  She reports that she was "weak for a few days" after her last treatment.  She continues to have her baseline level of diarrhea with no recent increase.  She notes that she has been "eating a lot".  Her energy is quite poor and she ranks it as a 3 out of 10.  She is not currently in any pain.  She notes that there have been some changes to her blood pressure medications and she is currently taking 50  mg of hydralazine in the morning and 25 mg at night.  She is not having any issues with lightheadedness or dizziness.  She otherwise denies any fevers, chills, sweats, nausea, vomiting.  A full 10 point ROS is listed below.  MEDICAL HISTORY:  Past Medical History:  Diagnosis Date   CKD (chronic kidney disease), stage V ([place])    Diabetes mellitus without complication ([place])    Hypertension    Stroke [facility])     SURGICAL HISTORY: Past Surgical History:  Procedure Laterality Date   COLON SURGERY     polypectomy 1004   EYE SURGERY     cataract   FRACTURE SURGERY     IR RADIOLOGIST EVAL & MGMT  09/19/2021   SPINE SURGERY     Dr Carloyn Manner 2006   TUBAL LIGATION      SOCIAL HISTORY: Social History   Socioeconomic History   Marital status: Widowed    Spouse name: Not on file   Number of children: Not on file   Years of education: Not on file   Highest education level: Not on file  Occupational History   Occupation: driver gaa    Employer: [employer]    Comment: works one morning a week  Tobacco Use   Smoking status: Never   Smokeless tobacco: Never  Vaping Use   Vaping Use: Never used  Substance and Sexual Activity   Alcohol use: No   Drug use: No   Sexual activity: Never  Other Topics Concern   Not on file  Social History Narrative   Seldom exercises.  Social Determinants of Health   Financial Resource Strain: Not on file  Food Insecurity: Not on file  Transportation Needs: Not on file  Physical Activity: Not on file  Stress: Not on file  Social Connections: Not on file  Intimate Partner Violence: Not on file    FAMILY HISTORY: Family History  Problem Relation Age of Onset   Cancer Mother        unknown primary; mets; d. 37   Liver cancer Sister 63   Lung cancer Brother 19       smoking hx   Cancer Maternal Aunt        unknown type; d. after 71   Cancer Paternal Aunt        unknown type; d. after 50   Kidney cancer Son        dx 45s   Lung cancer Son      ALLERGIES:  is allergic to penicillins and statins.  MEDICATIONS:  Current Outpatient Medications  Medication Sig Dispense Refill   amLODipine (NORVASC) 5 MG tablet Take 1 tablet (5 mg total) by mouth daily. (Patient taking differently: Take 5 mg by mouth 2 (two) times daily.) 90 tablet 0   aspirin 81 MG tablet Take 81 mg by mouth 2 (two) times daily.     atorvastatin (LIPITOR) 40 MG tablet Take 40 mg by mouth daily. 1/2 tablet evenings     brimonidine (ALPHAGAN) 0.2 % ophthalmic solution Place 1 drop into the right eye 2 times daily.     calcium acetate, Phos Binder, (PHOSLYRA) 667 MG/5ML SOLN Take 667 mg by mouth in the morning and at bedtime.     Carboxymethylcellulose Sodium (ARTIFICIAL TEARS OP) Apply to eye.     cloNIDine (CATAPRES) 0.1 MG tablet Take 0.1 mg by mouth 2 (two) times daily.     dorzolamide-timolol (COSOPT) 22.3-6.8 MG/ML ophthalmic solution      Ferrous Sulfate Dried 200 (65 FE) MG TABS Take by mouth.     furosemide (LASIX) 20 MG tablet Take 20 mg by mouth daily.     glimepiride (AMARYL) 2 MG tablet Take 2 mg by mouth daily with breakfast. Takes 1/2 tablet     hydrALAZINE (APRESOLINE) 25 MG tablet Take 25 mg by mouth 2 (two) times daily.     losartan-hydrochlorothiazide (HYZAAR) 100-25 MG tablet Take 1 tablet by mouth daily. 30 tablet 1   Multiple Vitamin (MULTIVITAMIN) tablet Take 1 tablet by mouth daily.     Netarsudil Dimesylate (RHOPRESSA OP) Apply to eye.     No current facility-administered medications for this visit.    REVIEW OF SYSTEMS:   Constitutional: ( - ) fevers, ( - )  chills , ( - ) night sweats Eyes: ( - ) blurriness of vision, ( - ) double vision, ( - ) watery eyes Ears, nose, mouth, throat, and face: ( - ) mucositis, ( - ) sore throat Respiratory: ( - ) cough, ( - ) dyspnea, ( - ) wheezes Cardiovascular: ( - ) palpitation, ( - ) chest discomfort, ( - ) lower extremity swelling Gastrointestinal:  ( - ) nausea, ( - ) heartburn, ( - ) change in  bowel habits Skin: ( - ) abnormal skin rashes Lymphatics: ( - ) new lymphadenopathy, ( - ) easy bruising Neurological: ( - ) numbness, ( - ) tingling, ( - ) new weaknesses Behavioral/Psych: ( - ) mood change, ( - ) new changes  All other systems were reviewed with the patient and are negative.  PHYSICAL EXAMINATION: ECOG PERFORMANCE STATUS: 2 - Symptomatic, <50% confined to bed  Vitals:   10/26/21 0819  BP: (!) 145/47  Pulse: 68  Resp: 15  Temp: (!) 97.5 F (36.4 C)  SpO2: 100%   Filed Weights   10/26/21 0819  Weight: 138 lb 11.2 oz (62.9 kg)    GENERAL: Elderly Caucasian female, alert, no distress and comfortable SKIN: skin color, texture, turgor are normal, no rashes or significant lesions EYES: conjunctiva are pink and non-injected, sclera clear LUNGS: clear to auscultation and percussion with normal breathing effort HEART: regular rate & rhythm and no murmurs and no lower extremity edema Musculoskeletal: no cyanosis of digits and no clubbing  PSYCH: alert & oriented x 3, fluent speech NEURO: no focal motor/sensory deficits  LABORATORY DATA:  I have reviewed the data as listed    Latest Ref Rng & Units 10/25/2021    2:40 PM 10/12/2021    9:43 AM 09/28/2021    9:48 AM  CBC  WBC 4.0 - 10.5 K/uL 5.4  5.0  5.9   Hemoglobin 12.0 - 15.0 g/dL 7.9  8.1  8.0   Hematocrit 36.0 - 46.0 % 24.0  24.5  24.4   Platelets 150 - 400 K/uL 223  220  200        Latest Ref Rng & Units 10/25/2021    2:40 PM 10/12/2021    9:43 AM 09/28/2021    9:48 AM  CMP  Glucose 70 - 99 mg/dL 98  99  94   BUN 8 - 23 mg/dL 84  64  81   Creatinine 0.44 - 1.00 mg/dL 5.16  4.79  5.33   Sodium 135 - 145 mmol/L 132  135  138   Potassium 3.5 - 5.1 mmol/L 5.9  5.0  5.4   Chloride 98 - 111 mmol/L 104  105  110   CO2 22 - 32 mmol/L '20  22  19   '$ Calcium 8.9 - 10.3 mg/dL 8.7  8.7  9.0   Total Protein 6.5 - 8.1 g/dL 6.3  6.2  6.1   Total Bilirubin 0.3 - 1.2 mg/dL 0.3  0.3  0.3   Alkaline Phos 38 - 126 U/L  199  196  191   AST 15 - 41 U/L 125  76  86   ALT 0 - 44 U/L 156  114  116     RADIOGRAPHIC STUDIES: No results found.  ASSESSMENT & PLAN KYLE STANSELL is a 88 y.o. female who presents to the clinic for follow up for newly diagnosed hepatocellular carcinoma.  We reviewed the recent MRI imaging and pathology report in detail that showed a dominant mass measuring 9.9 x 6.0 x 8.3 cm in the central right hepatic lobe along with a small peripheral metastasis superior to the dominant mass.    Discussed different treatment interventions including surgical resection, liver directed therapy and systemic therapies.  Based on patient's age, comorbidities and size of the liver mass, it is unlikely that she will be a candidate for surgery.  We will request a referral to interventional radiology to discuss liver directed therapies.  If patient is only a candidate for systemic therapy, likely recommend immunotherapy.  Her numerous comorbidities and advanced age would recommend pursuing immunotherapy alone.  Patient was agreeable to durvalumab every 4 weeks until intolerance or progression.   #Hepatocellular carcinoma:  --CT chest confirms localized unresectable disease.  --patient is not a candidate for liver directed therapies --baseline AFP 459 on 09/17/2021.  --  10/07/2021 is Cycle 1 Day 1 of Durvalumab  Plan: --will proceed with Cycle 2 Day 1 of Durvalumab today -- Plan for next MRI abdomen in August 2023 --Continue to check CBC, CMP, LDH and AFP at subsequent visits -- Labs yesterday show white blood cell count 5.4, hemoglobin 7.9, platelets 223, and creatinine 5.16. --Return to clinic in 4 weeks time   #CKD, stage 5: --Under the care of Dr. Biagio Quint, nephrologist at [facility]. --Patient has declined dialysis at this time  --Previously received Epo injection last on 07/17/2021. Holding future injections due to diagnosis of [place].    #Family history:  --Patient  has strong  family history of cancer. Requested consultation with our genetic counselors.   #Iron deficiency anemia: --Currently takes ferrous sulfate  --OK to continue, iron sat 17% on 09/17/2021.     No orders of the defined types were placed in this encounter.   All questions were answered. The patient knows to call the clinic with any problems, questions or concerns.  A total of more than 30 minutes were spent on this encounter with face-to-face time and non-face-to-face time, including preparing to see the patient, ordering tests and/or medications, counseling the patient and coordination of care as outlined above.   Ledell Peoples, MD Department of Hematology/Oncology [place] at [facility] Phone: [phone] Pager: [phone] Email: [email or web address]  10/26/2021 8:56 AM

## 2021-10-26 NOTE — Progress Notes (Signed)
OK to treat with elevated creatinine and LFT's and HGB of 7.9 per Dr. Lorenso Courier

## 2021-10-26 NOTE — Patient Instructions (Signed)
[facility] ONCOLOGY  Discharge Instructions: Thank you for choosing [facility] to provide your oncology and hematology care.   If you have a lab appointment with the [facility], please go directly to the [facility] and check in at the registration area.   Wear comfortable clothing and clothing appropriate for easy access to any Portacath or PICC line.   We strive to give you quality time with your provider. You may need to reschedule your appointment if you arrive late (15 or more minutes).  Arriving late affects you and other patients whose appointments are after yours.  Also, if you miss three or more appointments without notifying the office, you may be dismissed from the clinic at the provider's discretion.      For prescription refill requests, have your pharmacy contact our office and allow 72 hours for refills to be completed.    Today you received the following chemotherapy and/or immunotherapy agents imfinzi      To help prevent nausea and vomiting after your treatment, we encourage you to take your nausea medication as directed.  BELOW ARE SYMPTOMS THAT SHOULD BE REPORTED IMMEDIATELY: *FEVER GREATER THAN 100.4 F (38 C) OR HIGHER *CHILLS OR SWEATING *NAUSEA AND VOMITING THAT IS NOT CONTROLLED WITH YOUR NAUSEA MEDICATION *UNUSUAL SHORTNESS OF BREATH *UNUSUAL BRUISING OR BLEEDING *URINARY PROBLEMS (pain or burning when urinating, or frequent urination) *BOWEL PROBLEMS (unusual diarrhea, constipation, pain near the anus) TENDERNESS IN MOUTH AND THROAT WITH OR WITHOUT PRESENCE OF ULCERS (sore throat, sores in mouth, or a toothache) UNUSUAL RASH, SWELLING OR PAIN  UNUSUAL VAGINAL DISCHARGE OR ITCHING   Items with * indicate a potential emergency and should be followed up as soon as possible or go to the Emergency Department if any problems should occur.  Please show the CHEMOTHERAPY ALERT CARD or IMMUNOTHERAPY ALERT CARD at check-in to the  Emergency Department and triage nurse.  Should you have questions after your visit or need to cancel or reschedule your appointment, please contact [facility]  Dept: [phone]  and follow the prompts.  Office hours are 8:00 a.m. to 4:30 p.m. Monday - Friday. Please note that voicemails left after 4:00 p.m. may not be returned until the following business day.  We are closed weekends and major holidays. You have access to a nurse at all times for urgent questions. Please call the main number to the clinic Dept: [phone] and follow the prompts.   For any non-urgent questions, you may also contact your provider using MyChart. We now offer e-Visits for anyone 86 and older to request care online for non-urgent symptoms. For details visit [email or web address].   Also download the MyChart app! Go to the app store, search "MyChart", open the app, select [facility], and log in with your MyChart username and password.  Masks are optional in the cancer centers. If you would like for your care team to wear a mask while they are taking care of you, please let them know. For doctor visits, patients may have with them one support person who is at least 86 years old. At this time, visitors are not allowed in the infusion area.

## 2021-10-26 NOTE — Progress Notes (Signed)
REFERRING PROVIDER: Lincoln Brigham, PA-C [place],  [place] [address]  PRIMARY PROVIDER:  Leonides Sake, MD  PRIMARY REASON FOR VISIT:  1. Hepatocellular carcinoma ([place])   2. Family history of liver cancer   3. Family history of kidney cancer      HISTORY OF PRESENT ILLNESS:   Rose Cruz, a 88 y.o. female, was seen for a [facility] cancer genetics consultation at the request of Dede Query, PA-C due to a personal and family history of cancer.  Rose Cruz presents to clinic today to discuss the possibility of a hereditary predisposition to cancer, to discuss genetic testing, and to further clarify her future cancer risks, as well as potential cancer risks for family members.   In 2023, at the age of 45, Rose Cruz was diagnosed with hepatocellular carcinoma.  She also has a history of skin cancer removed from her face after the age of 75.    CANCER HISTORY:  Oncology History  Hepatocellular carcinoma ([place])  09/18/2021 Initial Diagnosis   Hepatocellular carcinoma ([place])   09/28/2021 -  Chemotherapy   Patient is on Treatment Plan : [place] Durvalumab 1500 mg/m2        Past Medical History:  Diagnosis Date   CKD (chronic kidney disease), stage V ([place])    Diabetes mellitus without complication ([place])    Family history of kidney cancer 10/26/2021   Family history of liver cancer 10/26/2021   Hypertension    Stroke [facility])     Past Surgical History:  Procedure Laterality Date   COLON SURGERY     polypectomy 1004   EYE SURGERY     cataract   FRACTURE SURGERY     IR RADIOLOGIST EVAL & MGMT  09/19/2021   SPINE SURGERY     Dr Carloyn Manner 2006   TUBAL LIGATION      FAMILY HISTORY:  We obtained a detailed, 4-generation family history.  Significant diagnoses are listed below: Family History  Problem Relation Age of Onset   Cancer Mother        unknown primary; mets; d. 11   Liver cancer Sister 62   Lung cancer Brother 60       smoking hx   Cancer Maternal Aunt         unknown type; d. after 62   Cancer Paternal Aunt        unknown type; d. after 50   Kidney cancer Son        dx 2s   Lung cancer Son     Rose Cruz is unaware of previous family history of genetic testing for hereditary cancer risks. There is no reported Ashkenazi Jewish ancestry. There is no known consanguinity.  GENETIC COUNSELING ASSESSMENT: Rose Cruz is a 88 y.o. female with a personal and family history of cancer which is not highly suggestive of a hereditary cancer syndrome. We, therefore, discussed and recommended the following at today's visit.   DISCUSSION: We discussed that, in general, most cancer is not inherited in families, but instead is sporadic or familial. Sporadic cancers occur by chance and typically happen at older ages (>50 years) as this type of cancer is caused by genetic changes acquired during an individual's lifetime. Some families have more cancers than would be expected by chance; however, the ages or types of cancer are not consistent with a known genetic mutation or known genetic mutations have been ruled out. This type of familial cancer is thought to be due to a  combination of multiple genetic, environmental, hormonal, and lifestyle factors. While this combination of factors likely increases the risk of cancer, the exact source of this risk is not currently identifiable or testable.    We discussed that approximately 5-10% of cancer is hereditary, meaning that it is due to a mutation in a single gene that is passed down from generation to generation in a family. We discussed that testing can be beneficial for several reasons, including understanding if other family members could be at risk for cancer and allow them to undergo genetic testing.  We discussed with Rose Cruz that the personal and family history does not meet NCCN criteria for genetic testing and, therefore, is not highly consistent with a familial hereditary cancer syndrome.  We feel she is at low  risk to harbor a gene mutation associated with such a condition. However, we discussed it is reasonable to have genetic tesitng, given that her mother had a metastatic cancer of unknown primary diagnosed before the age of 12.   Rose Cruz was interested in genetic testing.   The CancerNext-Expanded gene panel offered by [facility] and includes sequencing, rearrangement, and RNA analysis for the following 77 genes: AIP, ALK, APC, ATM, AXIN2, BAP1, BARD1, BLM, BMPR1A, BRCA1, BRCA2, BRIP1, CDC73, CDH1, CDK4, CDKN1B, CDKN2A, CHEK2, CTNNA1, DICER1, FANCC, FH, FLCN, GALNT12, KIF1B, LZTR1, MAX, MEN1, MET, MLH1, MSH2, MSH3, MSH6, MUTYH, NBN, NF1, NF2, NTHL1, PALB2, PHOX2B, PMS2, POT1, PRKAR1A, PTCH1, PTEN, RAD51C, RAD51D, RB1, RECQL, RET, SDHA, SDHAF2, SDHB, SDHC, SDHD, SMAD4, SMARCA4, SMARCB1, SMARCE1, STK11, SUFU, TMEM127, TP53, TSC1, TSC2, VHL and XRCC2 (sequencing and deletion/duplication); EGFR, EGLN1, HOXB13, KIT, MITF, PDGFRA, POLD1, and POLE (sequencing only); EPCAM and GREM1 (deletion/duplication only).   PLAN: After considering the risks, benefits, and limitations, Rose Cruz provided informed consent to pursue genetic testing and the blood sample was sent to [facility] for analysis of the CancerNext-Expanded +RNAinsight Panel. Results should be available within approximately 3 weeks' time, at which point they will be disclosed by telephone to Rose Cruz, as will any additional recommendations warranted by these results. Rose Cruz will receive a summary of her genetic counseling visit and a copy of her results once available. This information will also be available in Epic.   Rose Cruz questions were answered to her satisfaction today. Our contact information was provided should additional questions or concerns arise. Thank you for the referral and allowing [place] to share in the care of your patient.   Rose Cruz M. Joette Catching, [place], [facility] Genetic Counselor [email or web address] (P)  [phone]  The patient was seen for a total of 40 minutes in face-to-face genetic counseling. The patient was seen alone.  Drs. Lindi Adie and/or Burr Medico were available to discuss this case as needed.    _______________________________________________________________________ For Office Staff:  Number of people involved in session: 1 Was an Intern/ student involved with case: no

## 2021-10-29 ENCOUNTER — Telehealth: Payer: Self-pay | Admitting: Hematology and Oncology

## 2021-10-29 NOTE — Telephone Encounter (Signed)
Patient called to reschedule upcoming appointment. Patient is aware of changes.

## 2021-10-30 DIAGNOSIS — E785 Hyperlipidemia, unspecified: Secondary | ICD-10-CM | POA: Diagnosis not present

## 2021-10-30 DIAGNOSIS — E1122 Type 2 diabetes mellitus with diabetic chronic kidney disease: Secondary | ICD-10-CM | POA: Diagnosis not present

## 2021-11-01 ENCOUNTER — Telehealth: Payer: Self-pay | Admitting: *Deleted

## 2021-11-01 NOTE — Telephone Encounter (Signed)
Received call from patient asking about her schedule.. Reviewed it with her. We noted that she is missing infusion appts in August and September. Discussed with scheduling. They will make corrections and call pt.

## 2021-11-02 DIAGNOSIS — E785 Hyperlipidemia, unspecified: Secondary | ICD-10-CM | POA: Diagnosis not present

## 2021-11-02 DIAGNOSIS — C220 Liver cell carcinoma: Secondary | ICD-10-CM | POA: Diagnosis not present

## 2021-11-02 DIAGNOSIS — N185 Chronic kidney disease, stage 5: Secondary | ICD-10-CM | POA: Diagnosis not present

## 2021-11-02 DIAGNOSIS — I10 Essential (primary) hypertension: Secondary | ICD-10-CM | POA: Diagnosis not present

## 2021-11-02 DIAGNOSIS — E113593 Type 2 diabetes mellitus with proliferative diabetic retinopathy without macular edema, bilateral: Secondary | ICD-10-CM | POA: Diagnosis not present

## 2021-11-02 DIAGNOSIS — Z6824 Body mass index (BMI) 24.0-24.9, adult: Secondary | ICD-10-CM | POA: Diagnosis not present

## 2021-11-02 DIAGNOSIS — N2581 Secondary hyperparathyroidism of renal origin: Secondary | ICD-10-CM | POA: Diagnosis not present

## 2021-11-05 ENCOUNTER — Other Ambulatory Visit: Payer: Self-pay

## 2021-11-06 ENCOUNTER — Other Ambulatory Visit: Payer: Self-pay

## 2021-11-08 ENCOUNTER — Other Ambulatory Visit: Payer: Self-pay

## 2021-11-08 ENCOUNTER — Inpatient Hospital Stay: Payer: Medicare Other

## 2021-11-08 DIAGNOSIS — Z79899 Other long term (current) drug therapy: Secondary | ICD-10-CM | POA: Diagnosis not present

## 2021-11-08 DIAGNOSIS — C220 Liver cell carcinoma: Secondary | ICD-10-CM | POA: Diagnosis not present

## 2021-11-08 DIAGNOSIS — Z5112 Encounter for antineoplastic immunotherapy: Secondary | ICD-10-CM | POA: Diagnosis not present

## 2021-11-08 LAB — CBC WITH DIFFERENTIAL (CANCER CENTER ONLY)
Abs Immature Granulocytes: 0.01 10*3/uL (ref 0.00–0.07)
Basophils Absolute: 0 10*3/uL (ref 0.0–0.1)
Basophils Relative: 0 %
Eosinophils Absolute: 0.1 10*3/uL (ref 0.0–0.5)
Eosinophils Relative: 2 %
HCT: 23.8 % — ABNORMAL LOW (ref 36.0–46.0)
Hemoglobin: 7.8 g/dL — ABNORMAL LOW (ref 12.0–15.0)
Immature Granulocytes: 0 %
Lymphocytes Relative: 19 %
Lymphs Abs: 0.9 10*3/uL (ref 0.7–4.0)
MCH: 30 pg (ref 26.0–34.0)
MCHC: 32.8 g/dL (ref 30.0–36.0)
MCV: 91.5 fL (ref 80.0–100.0)
Monocytes Absolute: 0.5 10*3/uL (ref 0.1–1.0)
Monocytes Relative: 11 %
Neutro Abs: 3.2 10*3/uL (ref 1.7–7.7)
Neutrophils Relative %: 68 %
Platelet Count: 232 10*3/uL (ref 150–400)
RBC: 2.6 MIL/uL — ABNORMAL LOW (ref 3.87–5.11)
RDW: 13.6 % (ref 11.5–15.5)
WBC Count: 4.7 10*3/uL (ref 4.0–10.5)
nRBC: 0 % (ref 0.0–0.2)

## 2021-11-08 LAB — CMP (CANCER CENTER ONLY)
ALT: 99 U/L — ABNORMAL HIGH (ref 0–44)
AST: 80 U/L — ABNORMAL HIGH (ref 15–41)
Albumin: 3.1 g/dL — ABNORMAL LOW (ref 3.5–5.0)
Alkaline Phosphatase: 144 U/L — ABNORMAL HIGH (ref 38–126)
Anion gap: 6 (ref 5–15)
BUN: 89 mg/dL — ABNORMAL HIGH (ref 8–23)
CO2: 18 mmol/L — ABNORMAL LOW (ref 22–32)
Calcium: 7.9 mg/dL — ABNORMAL LOW (ref 8.9–10.3)
Chloride: 108 mmol/L (ref 98–111)
Creatinine: 5.19 mg/dL (ref 0.44–1.00)
GFR, Estimated: 8 mL/min — ABNORMAL LOW (ref >60)
Glucose, Bld: 146 mg/dL — ABNORMAL HIGH (ref 70–99)
Potassium: 5.1 mmol/L (ref 3.5–5.1)
Sodium: 132 mmol/L — ABNORMAL LOW (ref 135–145)
Total Bilirubin: 0.2 mg/dL — ABNORMAL LOW (ref 0.3–1.2)
Total Protein: 5.4 g/dL — ABNORMAL LOW (ref 6.5–8.1)

## 2021-11-08 LAB — LACTATE DEHYDROGENASE: LDH: 169 U/L (ref 98–192)

## 2021-11-09 LAB — AFP TUMOR MARKER: AFP, Serum, Tumor Marker: 380 ng/mL — ABNORMAL HIGH (ref 0.0–8.7)

## 2021-11-13 ENCOUNTER — Other Ambulatory Visit: Payer: Self-pay

## 2021-11-16 ENCOUNTER — Ambulatory Visit: Payer: Self-pay | Admitting: Genetic Counselor

## 2021-11-16 ENCOUNTER — Telehealth: Payer: Self-pay | Admitting: *Deleted

## 2021-11-16 ENCOUNTER — Telehealth: Payer: Self-pay | Admitting: Genetic Counselor

## 2021-11-16 ENCOUNTER — Encounter: Payer: Self-pay | Admitting: Genetic Counselor

## 2021-11-16 DIAGNOSIS — Z800 Family history of malignant neoplasm of digestive organs: Secondary | ICD-10-CM

## 2021-11-16 DIAGNOSIS — Z1379 Encounter for other screening for genetic and chromosomal anomalies: Secondary | ICD-10-CM

## 2021-11-16 DIAGNOSIS — Z8051 Family history of malignant neoplasm of kidney: Secondary | ICD-10-CM

## 2021-11-16 DIAGNOSIS — C220 Liver cell carcinoma: Secondary | ICD-10-CM

## 2021-11-16 NOTE — Telephone Encounter (Signed)
Received call from pt's daughter, Jenny Reichmann. She states her mother is very weak today, seems to be declining. Her mother told her she did not want any heroic measures, does not want to go to the hospital etc. She is asking if I would call and talk to her about having hospice come out to help her. Advised that I would call her when Cindy's brother got there in case Ms. Stoker was not able to speak. TCT patient and spoke with her. Advised that her daughter, Jenny Reichmann called me earlier and asked that I call to see how she was doing. Lizanne states that she is feeling weaker every day, has no appetite.  She is very fatigued. Denies pain, nausea, vomiting at this time. Asked pt if she needed to go to the ED or the hospital. Pt states she does not want to be in the hospital anymore. Asked pt if she wanted to continue her immunotherapy-her next appt is 11/23/21. She states that if she feels the way she does now, she won't come in for those appts. Asked patient about starting Hospice services at that point. At first she said no. But I was able to explain Hospice services in greater detail. She said that it made sense to do that but she wanted to think about it and discuss with her family.  She said she would let me know next week.  TCT patient's daughter, Jenny Reichmann and reviewed my conversation with her mom, with her. Cindy expressed gratitude for the discussions with her mom. She will follow up with her mom and call next week with any decisions that have been made. Dr. Lorenso Courier aware of the above

## 2021-11-16 NOTE — Progress Notes (Signed)
HPI:   Rose Cruz was previously seen in the [facility] due to a personal and family history of cancer and concerns regarding a hereditary predisposition to cancer. Please refer to our prior cancer genetics clinic note for more information regarding our discussion, assessment and recommendations, at the time. Rose Cruz recent genetic test results were disclosed to her, as were recommendations warranted by these results. These results and recommendations are discussed in more detail below.  CANCER HISTORY:    In 2023, at the age of 28, Rose Cruz was diagnosed with hepatocellular carcinoma.  She also has a history of skin cancer removed from her face after the age of 26.     Oncology History  Hepatocellular carcinoma ([place])  09/18/2021 Initial Diagnosis   Hepatocellular carcinoma ([place])   09/28/2021 -  Chemotherapy   Patient is on Treatment Plan : [place] Durvalumab 1500 mg/m2     11/14/2021 Genetic Testing   Negative hereditary cancer genetic testing: no pathogenic variants detected in [facility] CancerNext-Expanded +RNAinsight Panel.  Variant of uncertain significance reported in ATM at  p.M3011V (c.9031A>G) and MSH3 at p.E437G (c.1310A>G).  Report date is November 14, 2021.   The CancerNext-Expanded gene panel offered by [facility] and includes sequencing, rearrangement, and RNA analysis for the following 77 genes: AIP, ALK, APC, ATM, AXIN2, BAP1, BARD1, BLM, BMPR1A, BRCA1, BRCA2, BRIP1, CDC73, CDH1, CDK4, CDKN1B, CDKN2A, CHEK2, CTNNA1, DICER1, FANCC, FH, FLCN, GALNT12, KIF1B, LZTR1, MAX, MEN1, MET, MLH1, MSH2, MSH3, MSH6, MUTYH, NBN, NF1, NF2, NTHL1, PALB2, PHOX2B, PMS2, POT1, PRKAR1A, PTCH1, PTEN, RAD51C, RAD51D, RB1, RECQL, RET, SDHA, SDHAF2, SDHB, SDHC, SDHD, SMAD4, SMARCA4, SMARCB1, SMARCE1, STK11, SUFU, TMEM127, TP53, TSC1, TSC2, VHL and XRCC2 (sequencing and deletion/duplication); EGFR, EGLN1, HOXB13, KIT, MITF, PDGFRA, POLD1, and POLE (sequencing only); EPCAM and GREM1  (deletion/duplication only).      FAMILY HISTORY:  We obtained a detailed, 4-generation family history.  Significant diagnoses are listed below:      Family History  Problem Relation Age of Onset   Cancer Mother          unknown primary; mets; d. 24   Liver cancer Sister 13   Lung cancer Brother 23        smoking hx   Cancer Maternal Aunt          unknown type; d. after 43   Cancer Paternal Aunt          unknown type; d. after 60   Kidney cancer Son          dx 24s   Lung cancer Son       Rose Cruz is unaware of previous family history of genetic testing for hereditary cancer risks. There is no reported Ashkenazi Jewish ancestry. There is no known consanguinity.    GENETIC TEST RESULTS:  The [facility] CancerNext-Expanded +RNAinsight Panel found no pathogenic mutations.   The CancerNext-Expanded gene panel offered by [facility] and includes sequencing, rearrangement, and RNA analysis for the following 77 genes: AIP, ALK, APC, ATM, AXIN2, BAP1, BARD1, BLM, BMPR1A, BRCA1, BRCA2, BRIP1, CDC73, CDH1, CDK4, CDKN1B, CDKN2A, CHEK2, CTNNA1, DICER1, FANCC, FH, FLCN, GALNT12, KIF1B, LZTR1, MAX, MEN1, MET, MLH1, MSH2, MSH3, MSH6, MUTYH, NBN, NF1, NF2, NTHL1, PALB2, PHOX2B, PMS2, POT1, PRKAR1A, PTCH1, PTEN, RAD51C, RAD51D, RB1, RECQL, RET, SDHA, SDHAF2, SDHB, SDHC, SDHD, SMAD4, SMARCA4, SMARCB1, SMARCE1, STK11, SUFU, TMEM127, TP53, TSC1, TSC2, VHL and XRCC2 (sequencing and deletion/duplication); EGFR, EGLN1, HOXB13, KIT, MITF, PDGFRA, POLD1, and POLE (sequencing only); EPCAM and GREM1 (  deletion/duplication only).    The test report has been scanned into EPIC and is located under the Molecular Pathology section of the Results Review tab.  A portion of the result report is included below for reference. Genetic testing reported out on November 14, 2021.     Genetic testing did identify two variants of uncertain significance (VUS) - one in the ATM gene called  p.M3011V (c.9031A>G) and a second in  the MSH3 gene called p.E437G (c.1310A>G). At this time, it is unknown if these variants are associated with increased cancer risk or if they are normal findings, but most variants such as these get reclassified to being inconsequential. They should not be used to make medical management decisions. With time, we suspect the lab will determine the significance of these variants, if any. If we do learn more about them, we will try to contact Rose Cruz to discuss it further. However, it is important to stay in touch with us periodically and keep the address and phone number up to date.   Even though a pathogenic variant was not identified, possible explanations for the cancer in the family may include: There may be no hereditary risk for cancer in the family. The cancers in Rose Cruz and/or her family may be sporadic/familial or due to other genetic and environmental factors. There may be a gene mutation in one of these genes that current testing methods cannot detect but that chance is small. There could be another gene that has not yet been discovered, or that we have not yet tested, that is responsible for the cancer diagnoses in the family.  It is also possible there is a hereditary cause for the cancer in the family that Rose Cruz did not inherit.   Therefore, it is important to remain in touch with cancer genetics in the future so that we can continue to offer Rose Cruz the most up to date genetic testing.   ADDITIONAL GENETIC TESTING:  We discussed with Rose Cruz that her genetic testing was fairly extensive.  If there are additional relevant genes identified to increase cancer risk that can be analyzed in the future, we would be happy to discuss and coordinate this testing at that time.     CANCER SCREENING RECOMMENDATIONS:  Rose Cruz test result is considered negative (normal).  This means that we have not identified a hereditary cause for her personal history of hepatocellular  carcinoma at this time.   An individual's cancer risk and medical management are not determined by genetic test results alone. Overall cancer risk assessment incorporates additional factors, including personal medical history, family history, and any available genetic information that may result in a personalized plan for cancer prevention and surveillance. Therefore, it is recommended she continue to follow the cancer management and screening guidelines provided by her oncology and primary healthcare provider.  RECOMMENDATIONS FOR FAMILY MEMBERS:   Since she did not inherit a identifiable mutation in a cancer predisposition gene included on this panel, her children could not have inherited a known mutation from her in one of these genes. Individuals in this family might be at some increased risk of developing cancer, over the general population risk, due to the family history of cancer.  Individuals in the family should notify their providers of the family history of cancer.  We do not recommend familial testing for the ATM and MSH3 variants of uncertain significance (VUS).  FOLLOW-UP:  Lastly, we discussed with Rose Cruz that cancer genetics is a rapidly   advancing field and it is possible that new genetic tests will be appropriate for her and/or her family members in the future. We encouraged her to remain in contact with cancer genetics on an annual basis so we can update her personal and family histories and let her know of advances in cancer genetics that may benefit this family.   Our contact number was provided. Rose Cruz's questions were answered to her satisfaction, and she knows she is welcome to call us at anytime with additional questions or concerns.   Cari M. Koerner, MS, LCGC Genetic Counselor [email or web address] (P) [phone]  

## 2021-11-16 NOTE — Telephone Encounter (Signed)
Revealed negative genetics and VUS in ATM and MSH3.  Detailed clinic note to follow.

## 2021-11-21 ENCOUNTER — Other Ambulatory Visit: Payer: Medicare Other

## 2021-11-21 ENCOUNTER — Telehealth: Payer: Self-pay | Admitting: *Deleted

## 2021-11-21 NOTE — Telephone Encounter (Signed)
Received vm message from pt's daughter, requesting a call back. Called pt's home and spoke with Rose Cruz. She states that her mother is getting weaker and weaker. She states it takes 2 people to get get out of bed to a chair or to the bathroom. Rose Cruz is not eating much, sleeps a good portion of the time. The daughter and son asked about a CT scan. Advised that if she requires that much assistance and cannot make it here for her chemo, then likely she is too weak for labs and a scan. Discussed Hospice services again with pt's daughter. Rose Cruz is very open to Hospice services but her mother remains reluctant. Unclear as to why she is reluctant. Spoke with the patient as well about all of the above. She wants to wait a few more days before having Hospice come out. Spoke with Rose Cruz again and she states she will talk to her mother about it today and the days days ahead. She states she will call with updates. Dr. Lorenso Courier aware of the above.  Appts for 11/23/21 have been cancelled

## 2021-11-22 ENCOUNTER — Telehealth: Payer: Self-pay | Admitting: *Deleted

## 2021-11-22 ENCOUNTER — Other Ambulatory Visit: Payer: Self-pay | Admitting: Hematology and Oncology

## 2021-11-22 ENCOUNTER — Encounter (INDEPENDENT_AMBULATORY_CARE_PROVIDER_SITE_OTHER): Payer: Medicare Other | Admitting: Ophthalmology

## 2021-11-22 MED ORDER — TRAMADOL HCL 50 MG PO TABS: 50.0000 mg | ORAL_TABLET | Freq: Four times a day (QID) | ORAL | 0 refills | Status: AC | PRN

## 2021-11-22 NOTE — Telephone Encounter (Signed)
Received call from pt's daughter, Jenny Reichmann, asking about pain medications for her mom. She is experiencing increased RUQ pain. She is taking 1 ES tylenol every 6 hours without relief.   Please advise ASAP

## 2021-11-22 NOTE — Telephone Encounter (Signed)
TCT patient 's home. Spoke with her son. Advised that Dr. Lorenso Courier has sent in Tramadol for pain @ [facility] on [address]. He voiced understanding and will go pick it up.

## 2021-11-23 ENCOUNTER — Inpatient Hospital Stay: Payer: Medicare Other | Admitting: Hematology and Oncology

## 2021-11-23 ENCOUNTER — Inpatient Hospital Stay: Payer: Medicare Other

## 2021-11-25 DIAGNOSIS — E785 Hyperlipidemia, unspecified: Secondary | ICD-10-CM | POA: Diagnosis not present

## 2021-11-25 DIAGNOSIS — I120 Hypertensive chronic kidney disease with stage 5 chronic kidney disease or end stage renal disease: Secondary | ICD-10-CM | POA: Diagnosis not present

## 2021-11-25 DIAGNOSIS — R634 Abnormal weight loss: Secondary | ICD-10-CM | POA: Diagnosis not present

## 2021-11-25 DIAGNOSIS — C220 Liver cell carcinoma: Secondary | ICD-10-CM | POA: Diagnosis not present

## 2021-11-25 DIAGNOSIS — D630 Anemia in neoplastic disease: Secondary | ICD-10-CM | POA: Diagnosis not present

## 2021-11-25 DIAGNOSIS — N186 End stage renal disease: Secondary | ICD-10-CM | POA: Diagnosis not present

## 2021-11-25 DIAGNOSIS — I672 Cerebral atherosclerosis: Secondary | ICD-10-CM | POA: Diagnosis not present

## 2021-11-25 DIAGNOSIS — E43 Unspecified severe protein-calorie malnutrition: Secondary | ICD-10-CM | POA: Diagnosis not present

## 2021-11-25 DIAGNOSIS — H409 Unspecified glaucoma: Secondary | ICD-10-CM | POA: Diagnosis not present

## 2021-11-25 DIAGNOSIS — E1122 Type 2 diabetes mellitus with diabetic chronic kidney disease: Secondary | ICD-10-CM | POA: Diagnosis not present

## 2021-11-25 DIAGNOSIS — Z6820 Body mass index (BMI) 20.0-20.9, adult: Secondary | ICD-10-CM | POA: Diagnosis not present

## 2021-11-25 DIAGNOSIS — Z8673 Personal history of transient ischemic attack (TIA), and cerebral infarction without residual deficits: Secondary | ICD-10-CM | POA: Diagnosis not present

## 2021-11-26 ENCOUNTER — Telehealth: Payer: Self-pay | Admitting: *Deleted

## 2021-11-26 DIAGNOSIS — R634 Abnormal weight loss: Secondary | ICD-10-CM | POA: Diagnosis not present

## 2021-11-26 DIAGNOSIS — E43 Unspecified severe protein-calorie malnutrition: Secondary | ICD-10-CM | POA: Diagnosis not present

## 2021-11-26 DIAGNOSIS — C220 Liver cell carcinoma: Secondary | ICD-10-CM | POA: Diagnosis not present

## 2021-11-26 DIAGNOSIS — I120 Hypertensive chronic kidney disease with stage 5 chronic kidney disease or end stage renal disease: Secondary | ICD-10-CM | POA: Diagnosis not present

## 2021-11-26 DIAGNOSIS — I672 Cerebral atherosclerosis: Secondary | ICD-10-CM | POA: Diagnosis not present

## 2021-11-26 DIAGNOSIS — D630 Anemia in neoplastic disease: Secondary | ICD-10-CM | POA: Diagnosis not present

## 2021-11-26 NOTE — Telephone Encounter (Signed)
Received call from [facility] hospice. She states that Ms. Basques's adult children called [facility] for hospice services for MS. Rothenberger over the weekend. Rose Cruz was admitted to services on Sunday, 11/25/21.  Dr. Lorenso Courier will be the attending of record for theses services.

## 2021-11-27 DIAGNOSIS — I120 Hypertensive chronic kidney disease with stage 5 chronic kidney disease or end stage renal disease: Secondary | ICD-10-CM | POA: Diagnosis not present

## 2021-11-27 DIAGNOSIS — I672 Cerebral atherosclerosis: Secondary | ICD-10-CM | POA: Diagnosis not present

## 2021-11-27 DIAGNOSIS — R634 Abnormal weight loss: Secondary | ICD-10-CM | POA: Diagnosis not present

## 2021-11-27 DIAGNOSIS — D630 Anemia in neoplastic disease: Secondary | ICD-10-CM | POA: Diagnosis not present

## 2021-11-27 DIAGNOSIS — E43 Unspecified severe protein-calorie malnutrition: Secondary | ICD-10-CM | POA: Diagnosis not present

## 2021-11-27 DIAGNOSIS — C220 Liver cell carcinoma: Secondary | ICD-10-CM | POA: Diagnosis not present

## 2021-11-28 ENCOUNTER — Telehealth: Payer: Self-pay | Admitting: *Deleted

## 2021-11-28 DIAGNOSIS — D630 Anemia in neoplastic disease: Secondary | ICD-10-CM | POA: Diagnosis not present

## 2021-11-28 DIAGNOSIS — R634 Abnormal weight loss: Secondary | ICD-10-CM | POA: Diagnosis not present

## 2021-11-28 DIAGNOSIS — E43 Unspecified severe protein-calorie malnutrition: Secondary | ICD-10-CM | POA: Diagnosis not present

## 2021-11-28 DIAGNOSIS — I120 Hypertensive chronic kidney disease with stage 5 chronic kidney disease or end stage renal disease: Secondary | ICD-10-CM | POA: Diagnosis not present

## 2021-11-28 DIAGNOSIS — I672 Cerebral atherosclerosis: Secondary | ICD-10-CM | POA: Diagnosis not present

## 2021-11-28 DIAGNOSIS — C220 Liver cell carcinoma: Secondary | ICD-10-CM | POA: Diagnosis not present

## 2021-12-06 ENCOUNTER — Other Ambulatory Visit: Payer: Medicare Other

## 2021-12-14 NOTE — Telephone Encounter (Signed)
Received call from [facility] with [facility] stating that Rose Cruz died this morning@ 10:29 am. Her family was with her.

## 2021-12-14 DEATH — deceased

## 2021-12-20 ENCOUNTER — Ambulatory Visit: Payer: Medicare Other | Admitting: Hematology and Oncology

## 2021-12-20 ENCOUNTER — Other Ambulatory Visit: Payer: Medicare Other

## 2021-12-21 ENCOUNTER — Other Ambulatory Visit: Payer: Medicare Other

## 2021-12-21 ENCOUNTER — Ambulatory Visit: Payer: Medicare Other | Admitting: Hematology and Oncology

## 2021-12-21 ENCOUNTER — Ambulatory Visit: Payer: Medicare Other

## 2022-01-04 ENCOUNTER — Other Ambulatory Visit: Payer: Medicare Other

## 2022-01-18 ENCOUNTER — Ambulatory Visit: Payer: Medicare Other

## 2022-01-18 ENCOUNTER — Other Ambulatory Visit: Payer: Medicare Other

## 2022-01-18 ENCOUNTER — Ambulatory Visit: Payer: Medicare Other | Admitting: Hematology and Oncology

## 2022-02-01 ENCOUNTER — Other Ambulatory Visit: Payer: Medicare Other

## 2022-07-13 IMAGING — MR MR ABDOMEN WO/W CM
11 of 19 series · 23 of 48 positions shown · IV contrast (multihance)
Comparison: Ultrasound August 16, 2021

CLINICAL DATA: Further evaluation of mass seen in the right lobe of
the liver.

EXAM:
MRI ABDOMEN WITHOUT AND WITH CONTRAST
TECHNIQUE: Multiplanar multisequence MR imaging of the abdomen was performed
both before and after the administration of intravenous contrast.
CONTRAST:  13mL MULTIHANCE GADOBENATE DIMEGLUMINE 529 MG/ML IV SOLN

[Series 3: cor haste · coronal · 5.0mm · 0.68mm/px · 1 of 34 slices shown]
[im 1/34]
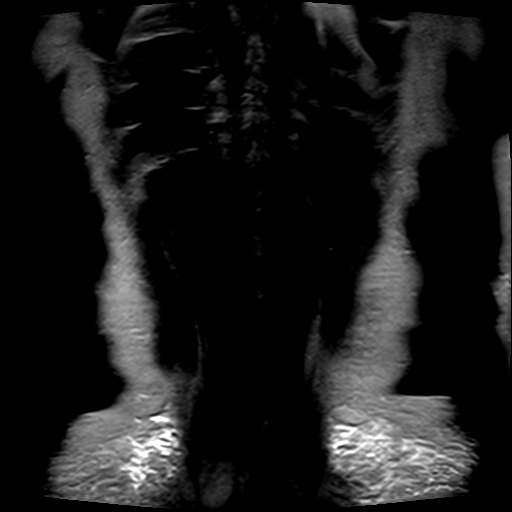

[Series 4: axial haste · axial · 6.0mm · 0.68mm/px · 1 of 36 slices shown]
[im 1/36]
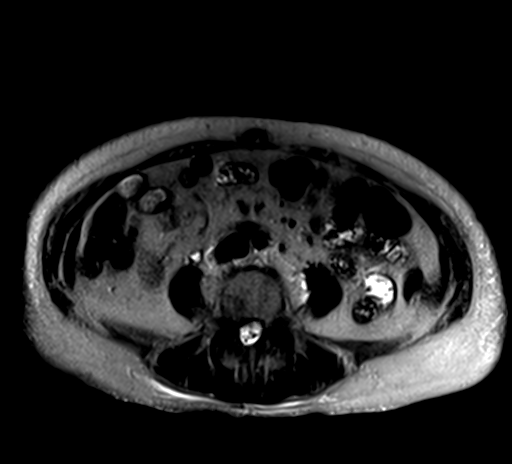

[Series 5: T1 · axial · 6.0mm · 0.68mm/px · z∈[-152,+79]mm · 2 of 72 slices shown]
[im 1/72]
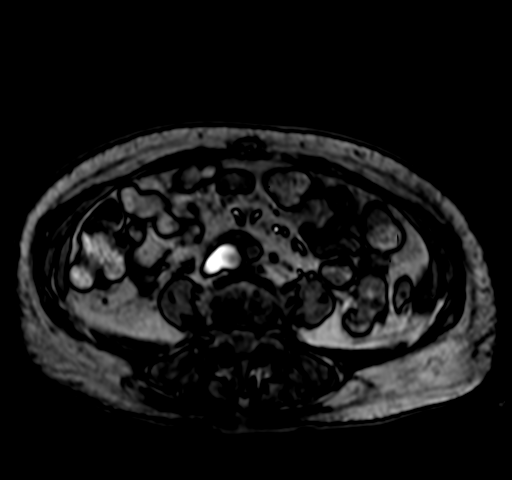
[im 72/72]
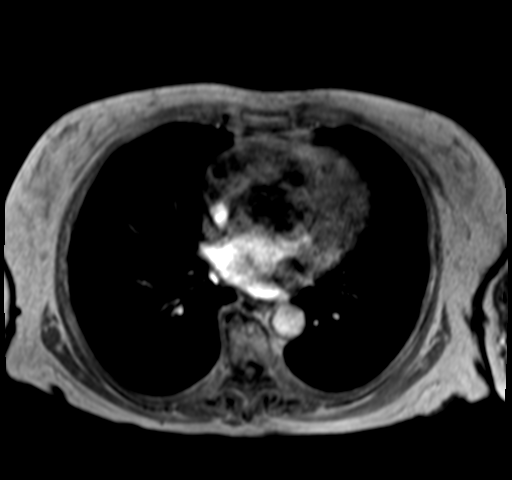

[Series 6: bSSFP · axial · 4.0mm · 0.68mm/px · z∈[-148,+76]mm · 2 of 57 slices shown]
[im 1/57]
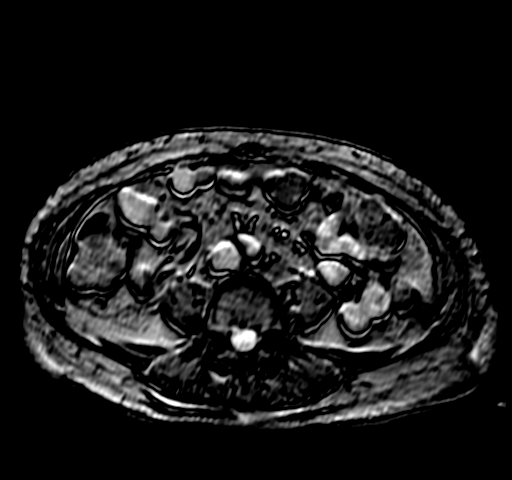
[im 57/57]
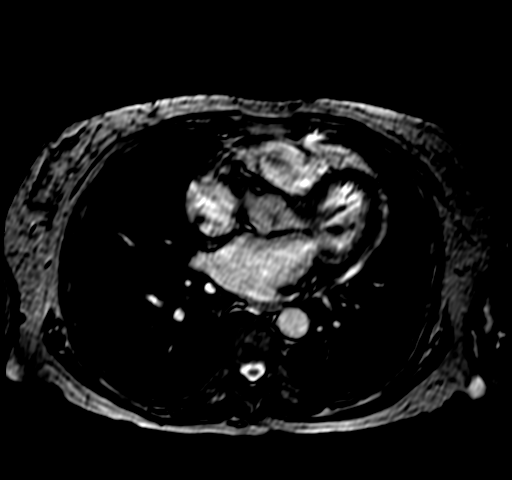

[Series 7: ep2d_diff_b50_500_800_p2_trig · axial · 6.0mm · 1.82mm/px · z∈[-142,+117]mm · 4 of 111 slices shown]
[im 1/111]
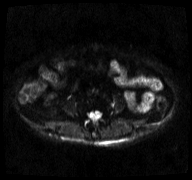
[im 37/111]
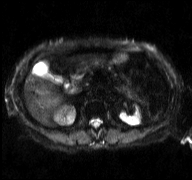
[im 74/111]
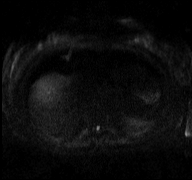
[im 111/111]
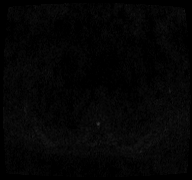

[Series 8: ep2d_diff_b50_500_800_p2_trig_adc · axial · 6.0mm · 1.82mm/px · 1 of 37 slices shown]
[im 1/37]
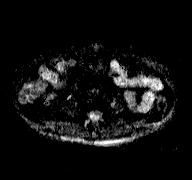

[Series 9: T2 fat-sat · axial · 6.0mm · 1.16mm/px · 1 of 36 slices shown]
[im 1/36]
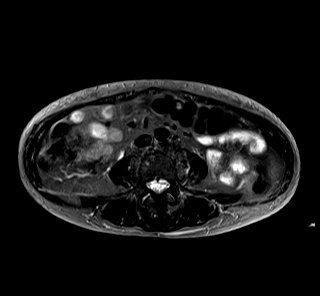

[Series 10: T1 dynamic · axial · non-contrast · 3.0mm · 0.68mm/px · z∈[-155,+82]mm · 3 of 80 slices shown]
[im 1/80]
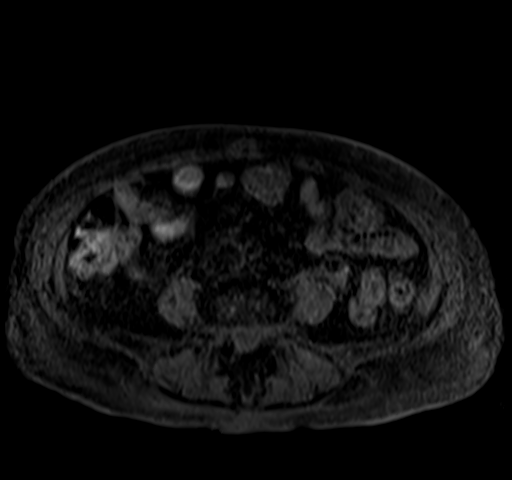
[im 40/80]
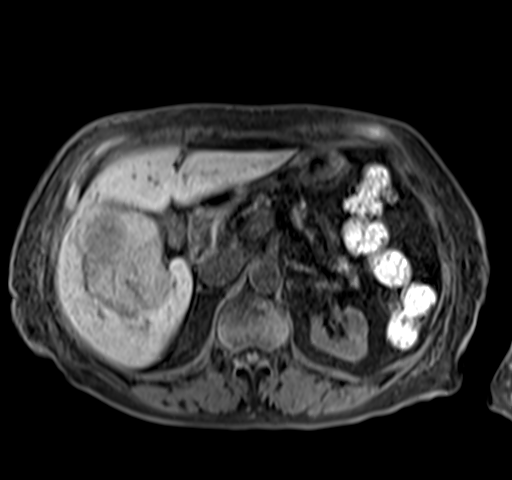
[im 80/80]
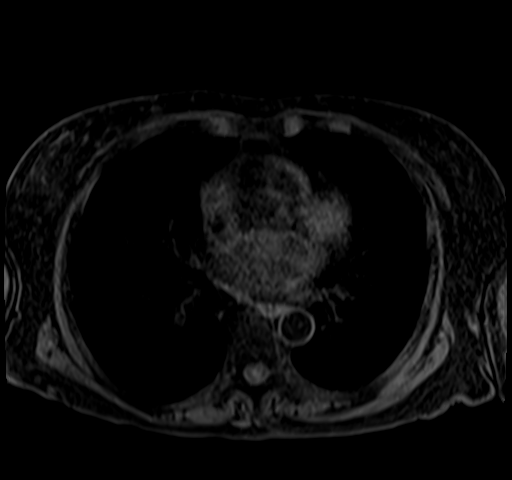

[Series 11: T1 dynamic post-contrast · axial · 3.0mm · 0.68mm/px · z∈[-155,+82]mm · 3 of 80 slices shown (1 of 3)]
[im 1/80]
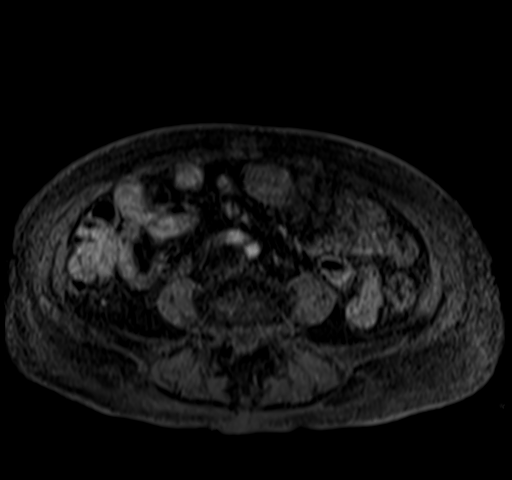
[im 40/80]
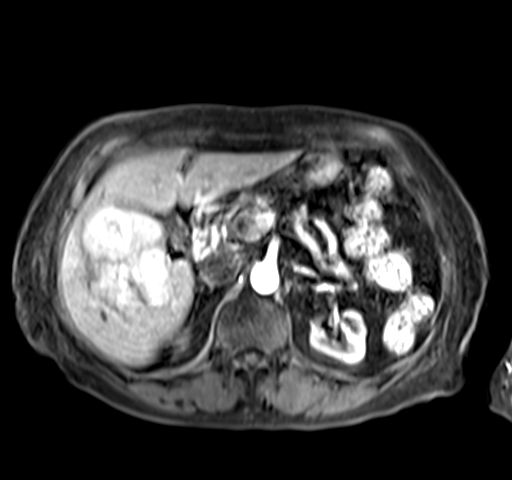
[im 80/80]
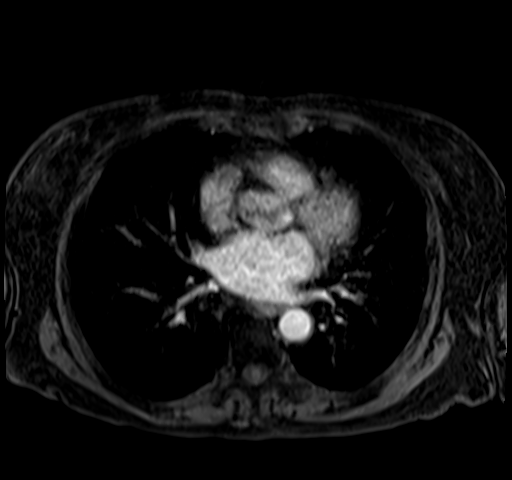

[Series 12: T1 dynamic post-contrast · axial · 3.0mm · 0.68mm/px · z∈[-155,+82]mm · 3 of 80 slices shown (2 of 3)]
[im 1/80]
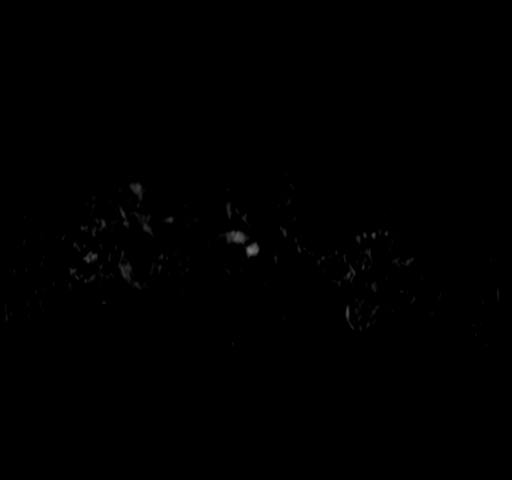
[im 40/80]
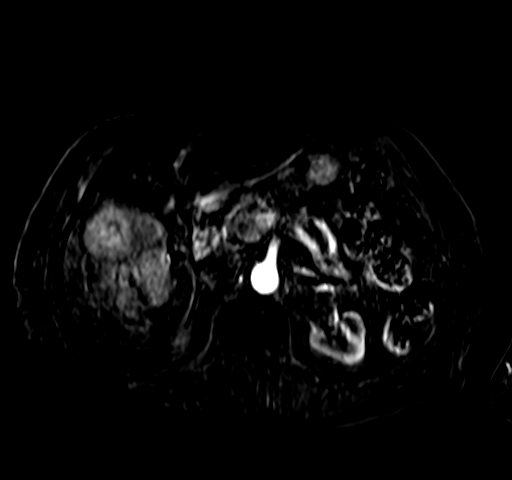
[im 80/80]
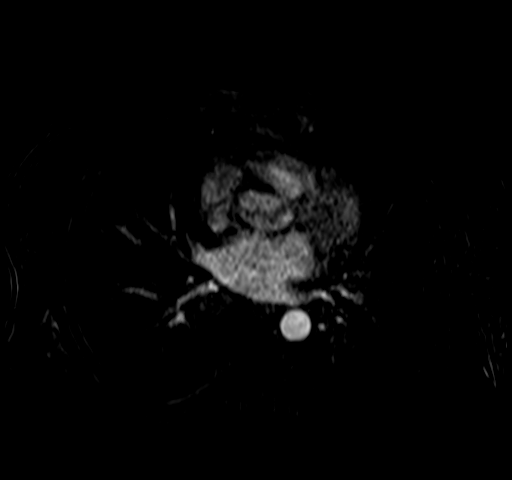

[Series 13: T1 dynamic post-contrast · axial · 3.0mm · 0.68mm/px · z∈[-155,-38]mm · 2 of 80 slices shown (3 of 3)]
[im 1/80]
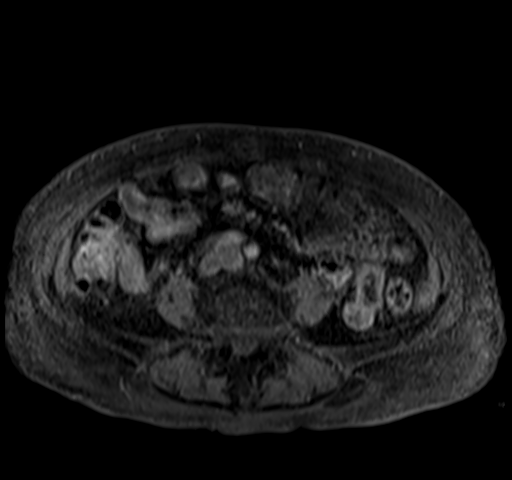
[im 40/80]
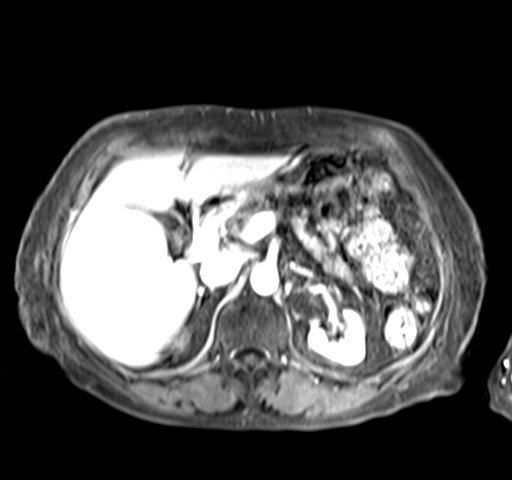

[23 of 48 positions shown; findings below may reference images not displayed]

FINDINGS: Lower chest: Limited MR evaluation reveals no acute abnormality.

Hepatobiliary: Arterially enhancing lesion in the central right
hepatic lobe measures 9.9 x 6.0 x 8.3 cm on images 36/11 and 36/17,
the lesion demonstrates washout, some central necrosis and probable
enhancing capsule. There is effacement of the vascular structures at
the hepatic hilum without discrete portal venous tumoral invasion.

Small lesion in the peripheral right lobe of the liver just superior
to the above lesion which measures 15 mm on image [DATE] and
demonstrates similar signal characteristics.

Gallbladder is unremarkable.  No biliary ductal dilation.

Pancreas: No pancreatic ductal dilation or evidence of acute
inflammation. Pancreatic atrophy. There are multiple scattered
well-circumscribed T2 hyperintense pancreatic lesions measuring up
to 6 mm in the body of the pancreas on image [DATE], none of which
demonstrate suspicious postcontrast enhancement or wall thickening.

Spleen:  No splenomegaly or focal splenic lesion.

Adrenals/Urinary Tract: Bilateral adrenal glands appear normal. No
hydronephrosis. No suspicious renal lesion. Slight atrophy of the
left kidney with lobular renal contour likely reflecting scarring.

Stomach/Bowel: Visualized portions within the abdomen are
unremarkable.

Vascular/Lymphatic: Normal caliber abdominal aorta. The portal,
splenic and superior mesenteric veins are patent. No pathologically
enlarged abdominal lymph nodes.

Other:  No significant abdominal free fluid.

Musculoskeletal: No suspicious bone lesions identified.
IMPRESSION: 1. Arterially enhancing 9.9 x 6.0 x 8.3 cm lesion in the central
right hepatic lobe demonstrates imaging characteristics most
consistent with hepatocellular carcinoma, consider definitive
characterization with direct tissue sampling.
2. Small lesion in the peripheral right lobe of the liver just
superior to the above lesion, likely reflects a small metastasis.
3. No abdominal adenopathy.
4. Multiple scattered well-circumscribed T2 hyperintense pancreatic
lesions measuring up to 6 mm in the body of the pancreas, none of
which demonstrate suspicious MRI features and are favored to reflect
side branch IPMNs. This potential oncologic patient would suggest
attention on follow-up imaging.

These results will be called to the ordering clinician or
representative by the Radiologist Assistant, and communication
documented in the PACS or [REDACTED].

## 2022-07-26 IMAGING — US US BIOPSY CORE LIVER
1 series · 15 of 25 positions shown · non-contrast
Comparison: US Abdomen, 08/16/2021.

INDICATION: Liver mass.  No diagnosis.

EXAM:
ULTRASOUND GUIDED LIVER LESION BIOPSY

[Series 1: us biopsy mc & wl · 31 acquisitions, 15 frames shown]
[im 1/31]
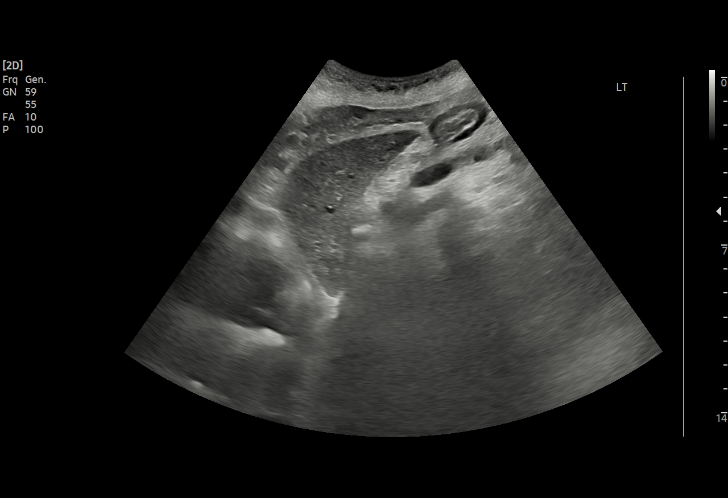
[im 3/31]
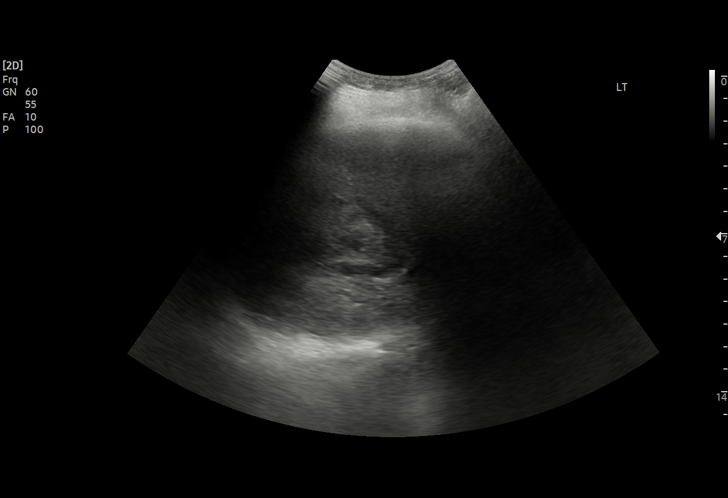
[im 6/31]
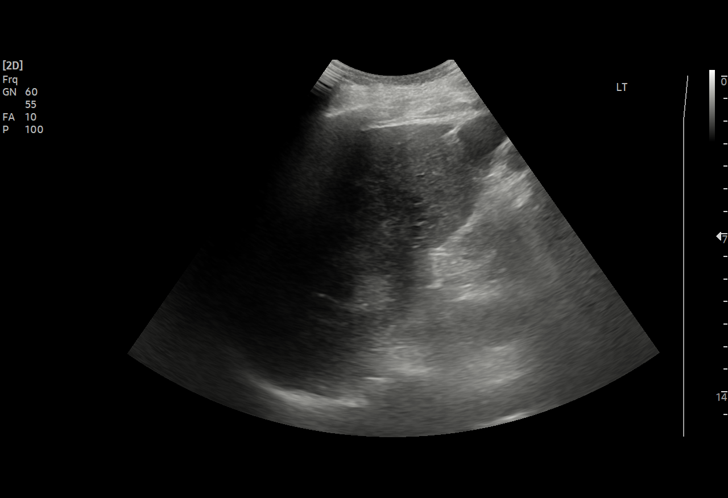
[im 7/31]
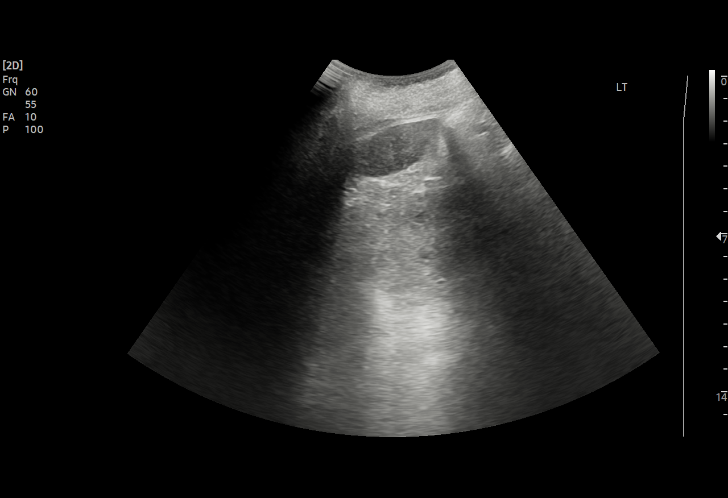
[im 9/31]
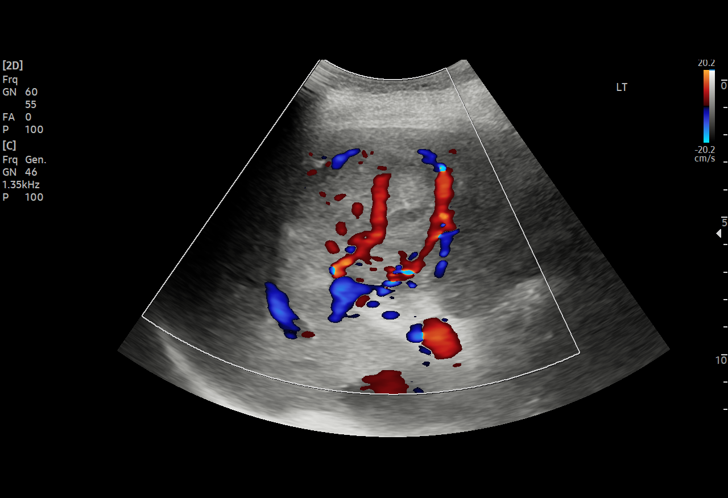
[im 12/31]
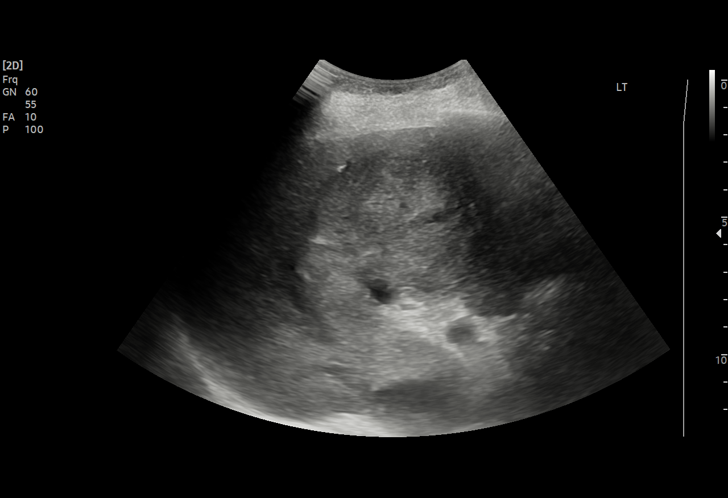
[im 13/31]
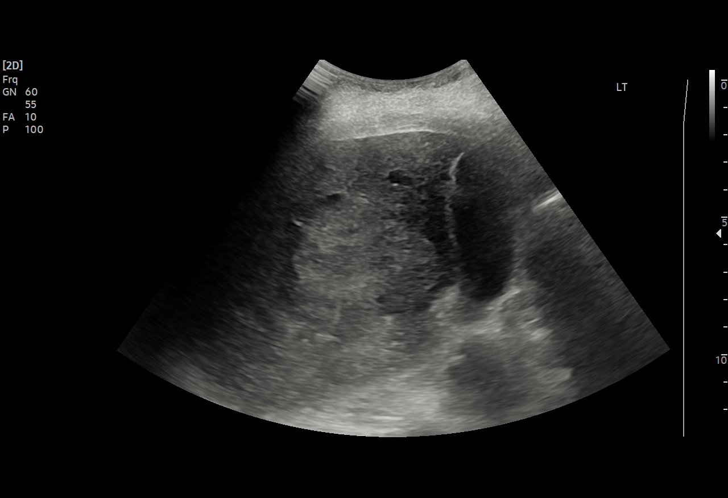
[im 16/31]
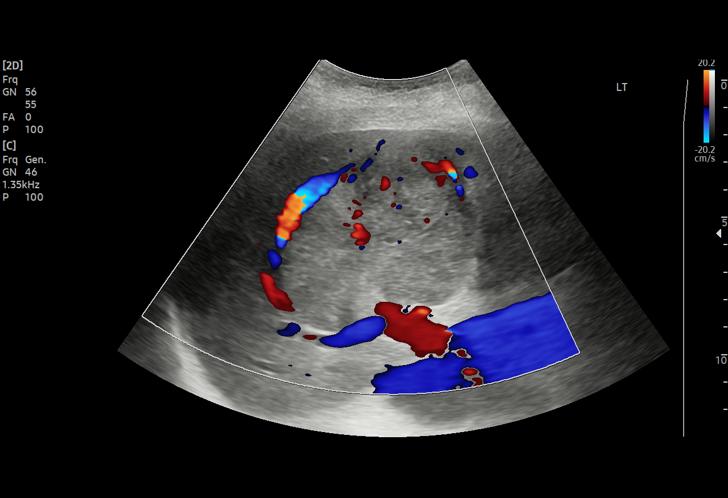
[im 18/31]
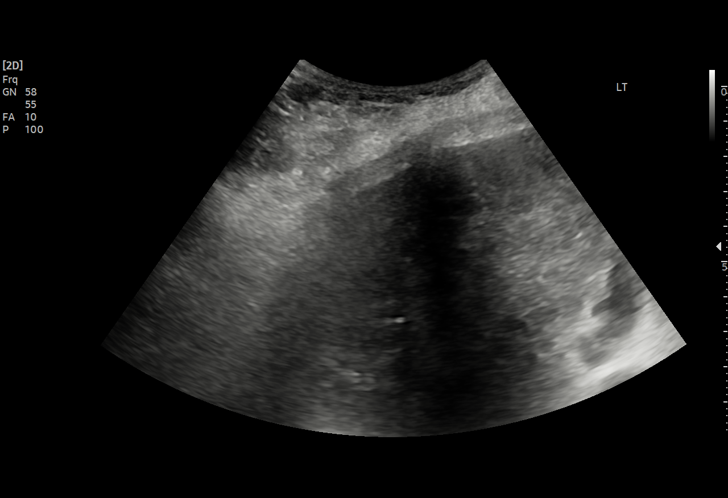
[im 19/31]
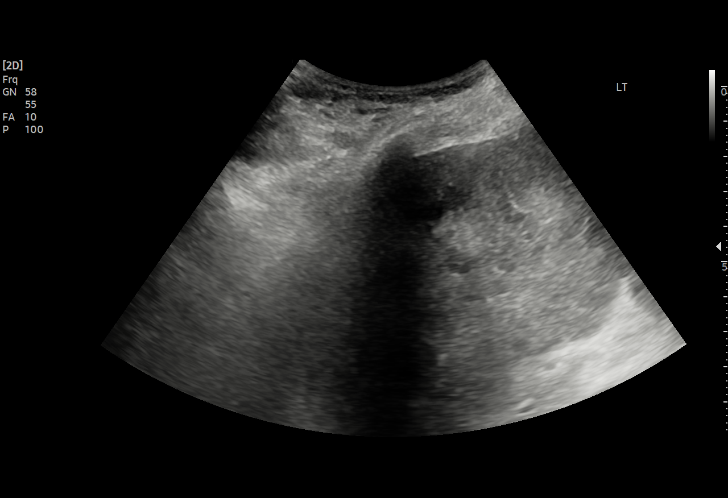
[im 22/31]
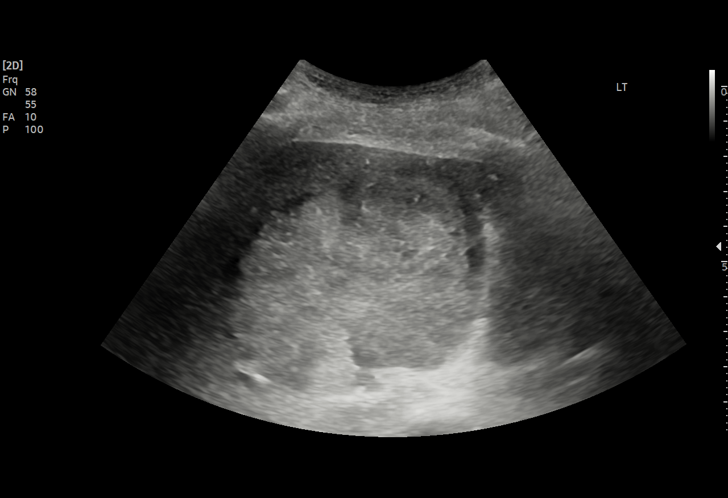
[im 24/31]
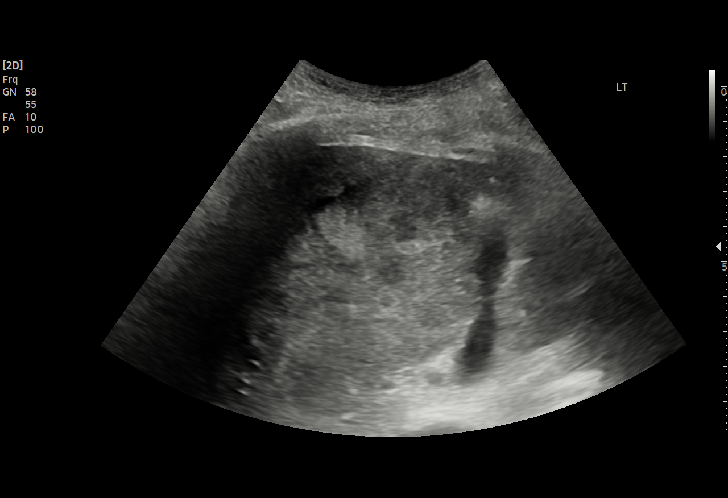
[im 26/31]
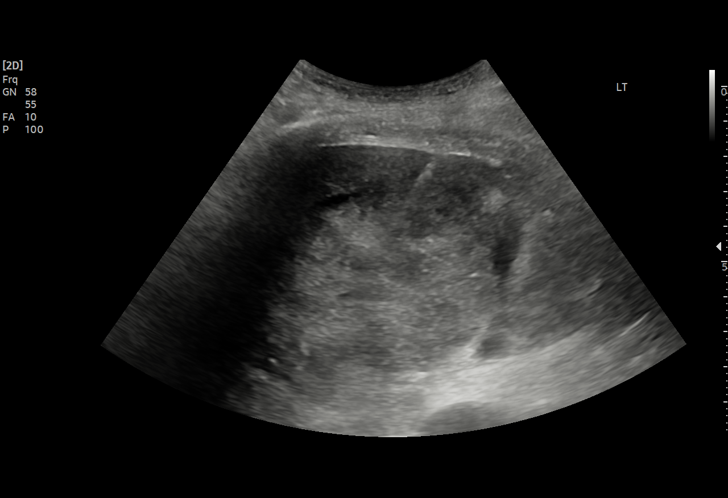
[im 28/31]
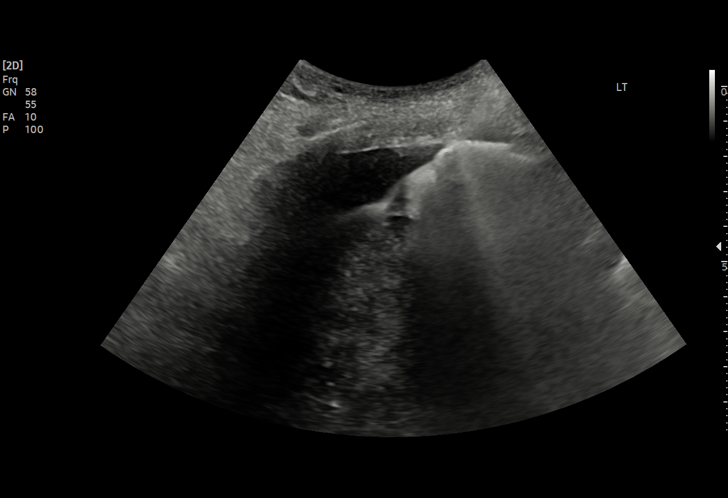
[im 31/31]
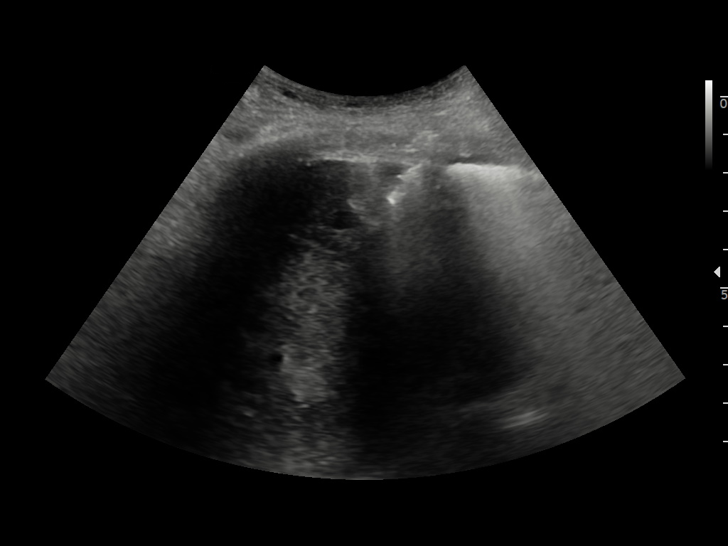

[15 of 25 positions shown; findings below may reference images not displayed]

MRI abdomen, 08/24/2021

MEDICATIONS:
None

ANESTHESIA/SEDATION:
Local anesthetic was administered.

The patient was continuously monitored during the procedure by the
interventional radiology nurse under my direct supervision.

COMPLICATIONS:
None immediate.

PROCEDURE:
Informed written consent was obtained from the patient and/or
patient's representative after a discussion of the risks, benefits
and alternatives to treatment. The patient understands and consents
the procedure. A timeout was performed prior to the initiation of
the procedure.

Ultrasound scanning was performed of the right upper abdominal
quadrant demonstrates a large RIGHT hepatic lobe mass measuring
approximately 8.3 x 7.0 x 7.3 cm.

The RIGHT hepatic lobe mass was selected for biopsy and the
procedure was planned. The right upper abdominal quadrant was
prepped and draped in the usual sterile fashion. The overlying soft
tissues were anesthetized with 1% lidocaine with epinephrine. A 17
gauge, 6.8 cm co-axial needle was advanced into a peripheral aspect
of the lesion. This was followed by 3 core biopsies with an 18 gauge
core device under direct ultrasound guidance.

The coaxial needle tract was embolized with a small amount of
Gel-Foam slurry and superficial hemostasis was obtained with manual
compression. Post procedural scanning was negative for definitive
area of hemorrhage or additional complication. A dressing was
placed. The patient tolerated the procedure well without immediate
post procedural complication.
IMPRESSION: Successful ultrasound guided targeted biopsy of RIGHT hepatic lobe
mass.
